# Patient Record
Sex: Female | Born: 1985 | Race: Black or African American | Hispanic: Yes | Marital: Single | Smoking: Current every day smoker
Health system: Southern US, Community
[De-identification: ages and names within clinical notes are randomized; demographics above are authoritative.]

## PROBLEM LIST (undated history)

## (undated) ENCOUNTER — Inpatient Hospital Stay: Payer: Self-pay

## (undated) DIAGNOSIS — U071 COVID-19: Secondary | ICD-10-CM

## (undated) DIAGNOSIS — J45909 Unspecified asthma, uncomplicated: Secondary | ICD-10-CM

## (undated) DIAGNOSIS — O24419 Gestational diabetes mellitus in pregnancy, unspecified control: Secondary | ICD-10-CM

## (undated) HISTORY — DX: Gestational diabetes mellitus in pregnancy, unspecified control: O24.419

---

## 2005-02-10 ENCOUNTER — Emergency Department: Payer: Self-pay | Admitting: Emergency Medicine

## 2005-10-25 ENCOUNTER — Emergency Department: Payer: Self-pay | Admitting: Emergency Medicine

## 2006-01-13 ENCOUNTER — Ambulatory Visit: Payer: Self-pay | Admitting: Advanced Practice Midwife

## 2006-03-15 ENCOUNTER — Emergency Department: Payer: Self-pay | Admitting: Emergency Medicine

## 2006-07-30 ENCOUNTER — Inpatient Hospital Stay: Payer: Self-pay

## 2007-02-07 ENCOUNTER — Emergency Department: Payer: Self-pay | Admitting: Emergency Medicine

## 2007-02-07 ENCOUNTER — Other Ambulatory Visit: Payer: Self-pay

## 2007-12-14 ENCOUNTER — Ambulatory Visit: Payer: Self-pay | Admitting: Family Medicine

## 2007-12-16 ENCOUNTER — Ambulatory Visit: Payer: Self-pay | Admitting: Family Medicine

## 2008-02-17 ENCOUNTER — Ambulatory Visit: Payer: Self-pay | Admitting: General Surgery

## 2008-02-21 ENCOUNTER — Ambulatory Visit: Payer: Self-pay | Admitting: General Surgery

## 2008-05-29 ENCOUNTER — Emergency Department: Payer: Self-pay | Admitting: Emergency Medicine

## 2008-08-07 ENCOUNTER — Ambulatory Visit: Payer: Self-pay | Admitting: Certified Nurse Midwife

## 2008-08-22 ENCOUNTER — Emergency Department: Payer: Self-pay | Admitting: Emergency Medicine

## 2008-10-27 ENCOUNTER — Emergency Department: Payer: Self-pay | Admitting: Emergency Medicine

## 2008-10-29 ENCOUNTER — Emergency Department: Payer: Self-pay | Admitting: Emergency Medicine

## 2008-10-29 ENCOUNTER — Observation Stay: Payer: Self-pay

## 2008-12-31 ENCOUNTER — Observation Stay: Payer: Self-pay | Admitting: Obstetrics and Gynecology

## 2009-01-21 ENCOUNTER — Observation Stay: Payer: Self-pay | Admitting: Obstetrics and Gynecology

## 2009-01-22 ENCOUNTER — Ambulatory Visit: Payer: Self-pay | Admitting: Obstetrics and Gynecology

## 2009-01-23 ENCOUNTER — Observation Stay: Payer: Self-pay

## 2009-02-10 ENCOUNTER — Observation Stay: Payer: Self-pay | Admitting: Obstetrics and Gynecology

## 2009-02-19 ENCOUNTER — Observation Stay: Payer: Self-pay | Admitting: Obstetrics and Gynecology

## 2009-02-26 ENCOUNTER — Observation Stay: Payer: Self-pay

## 2009-03-07 ENCOUNTER — Inpatient Hospital Stay: Payer: Self-pay | Admitting: Obstetrics and Gynecology

## 2011-03-28 ENCOUNTER — Emergency Department: Payer: Self-pay | Admitting: Unknown Physician Specialty

## 2011-06-03 ENCOUNTER — Emergency Department: Payer: Self-pay | Admitting: Emergency Medicine

## 2011-12-02 ENCOUNTER — Emergency Department: Payer: Self-pay | Admitting: Internal Medicine

## 2011-12-02 LAB — URINALYSIS, COMPLETE
Bacteria: NONE SEEN
Bilirubin,UR: NEGATIVE
Blood: NEGATIVE
Glucose,UR: NEGATIVE mg/dL (ref 0–75)
Leukocyte Esterase: NEGATIVE
Nitrite: NEGATIVE
WBC UR: <1 /HPF (ref 0–5)

## 2011-12-02 LAB — PREGNANCY, URINE: Pregnancy Test, Urine: NEGATIVE m[IU]/mL

## 2012-11-10 ENCOUNTER — Emergency Department: Payer: Self-pay | Admitting: Emergency Medicine

## 2014-01-24 ENCOUNTER — Emergency Department: Payer: Self-pay | Admitting: Emergency Medicine

## 2014-01-24 LAB — URINALYSIS, COMPLETE
BILIRUBIN, UR: NEGATIVE
Blood: NEGATIVE
Glucose,UR: NEGATIVE mg/dL (ref 0–75)
Ketone: NEGATIVE
LEUKOCYTE ESTERASE: NEGATIVE
Nitrite: NEGATIVE
PH: 7 (ref 4.5–8.0)
PROTEIN: NEGATIVE
Specific Gravity: 1.023 (ref 1.003–1.030)
Squamous Epithelial: 4
WBC UR: 2 /HPF (ref 0–5)

## 2014-01-26 LAB — URINE CULTURE

## 2015-05-26 NOTE — L&D Delivery Note (Signed)
Delivery Note At 8:42 AM a viable unspecified sex was delivered via Vaginal, Spontaneous Delivery (Presentation:LOA ).  APGAR: , ; weight pending.   Placenta status: spontaneous.  Cord: 3VC without complications.  Anesthesia:  Epidural Episiotomy:  none Lacerations:  none Est. Blood Loss (mL):  250mL  Mom to postpartum.  Baby to Couplet care / Skin to Skin.  Donna Forbes 02/22/2016, 8:52 AM

## 2015-06-03 ENCOUNTER — Encounter: Payer: Self-pay | Admitting: Emergency Medicine

## 2015-06-03 ENCOUNTER — Emergency Department
Admission: EM | Admit: 2015-06-03 | Discharge: 2015-06-03 | Disposition: A | Discharge status: Home / Self Care | Payer: Managed Care, Other (non HMO) | Attending: Emergency Medicine | Admitting: Emergency Medicine

## 2015-06-03 DIAGNOSIS — F17200 Nicotine dependence, unspecified, uncomplicated: Secondary | ICD-10-CM | POA: Insufficient documentation

## 2015-06-03 DIAGNOSIS — M545 Low back pain: Secondary | ICD-10-CM | POA: Insufficient documentation

## 2015-06-03 DIAGNOSIS — Z3202 Encounter for pregnancy test, result negative: Secondary | ICD-10-CM | POA: Insufficient documentation

## 2015-06-03 LAB — URINALYSIS COMPLETE WITH MICROSCOPIC (ARMC ONLY)
BILIRUBIN URINE: NEGATIVE
Bacteria, UA: NONE SEEN
Glucose, UA: NEGATIVE mg/dL
Hgb urine dipstick: NEGATIVE
KETONES UR: NEGATIVE mg/dL
LEUKOCYTES UA: NEGATIVE
NITRITE: NEGATIVE
Protein, ur: NEGATIVE mg/dL
Specific Gravity, Urine: 1.016 (ref 1.005–1.030)
pH: 7 (ref 5.0–8.0)

## 2015-06-03 LAB — POC URINE PREG, ED: PREG TEST UR: NEGATIVE

## 2015-06-03 MED ORDER — LIDOCAINE 5 % EX PTCH: 1.0000 | MEDICATED_PATCH | Freq: Two times a day (BID) | CUTANEOUS | Status: DC

## 2015-06-03 MED ORDER — CYCLOBENZAPRINE HCL 5 MG PO TABS: 5.0000 mg | ORAL_TABLET | Freq: Three times a day (TID) | ORAL | Status: DC | PRN

## 2015-06-03 MED ORDER — CYCLOBENZAPRINE HCL 10 MG PO TABS
5.0000 mg | ORAL_TABLET | Freq: Once | ORAL | Status: AC
  Administered 2015-06-03: 5 mg via ORAL
  Filled 2015-06-03: qty 1

## 2015-06-03 NOTE — ED Provider Notes (Signed)
[facility] Emergency Department Provider Note    ____________________________________________  Time seen: 1900  I have reviewed the triage vital signs and the nursing notes.   HISTORY  Chief Complaint Back Pain   History limited by: Not Limited   HPI CHAIA IKARD is a 30 y.o. female who presents to the emergency department today because of concerns for left lower back pain. The patient states she has been having some discomfort and pain there for the past 2 weeks. It has been constant. She states that yesterday it started becoming more severe. She denies any trauma to her back either 2 weeks ago her yesterday. She states that today almost any movement makes it worse. It is severe. It is sharp. She states she has never had pain quite like this before although she has had pinched nerves in her back before. She denies any fevers. Denies any dysuria. Denies any blood in her urine. She states she has just restarted her. After coming off of birth control.     History reviewed. No pertinent past medical history.  There are no active problems to display for this patient.   History reviewed. No pertinent past surgical history.  No current outpatient prescriptions on file.  Allergies Review of patient's allergies indicates no known allergies.  History reviewed. No pertinent family history.  Social History Social History  Substance Use Topics  . Smoking status: Current Every Day Smoker  . Smokeless tobacco: None  . Alcohol Use: Yes    Review of Systems  Constitutional: Negative for fever. Cardiovascular: Negative for chest pain. Respiratory: Negative for shortness of breath. Gastrointestinal: Negative for abdominal pain, vomiting and diarrhea. Genitourinary: Negative for dysuria. Musculoskeletal: Positive for left lower back pain Skin: Negative for rash. Neurological: Negative for headaches, focal weakness or numbness.   10-point ROS  otherwise negative.  ____________________________________________   PHYSICAL EXAM:  VITAL SIGNS:  98.2 F (36.8 C)  66  16  105/66 mmHg  98 %    Constitutional: Alert and oriented. Well appearing and in no distress. Eyes: Conjunctivae are normal. PERRL. Normal extraocular movements. ENT   Head: Normocephalic and atraumatic.   Nose: No congestion/rhinnorhea.   Mouth/Throat: Mucous membranes are moist.   Neck: No stridor. Hematological/Lymphatic/Immunilogical: No cervical lymphadenopathy. Cardiovascular: Normal rate, regular rhythm.  No murmurs, rubs, or gallops. Respiratory: Normal respiratory effort without tachypnea nor retractions. Breath sounds are clear and equal bilaterally. No wheezes/rales/rhonchi. Gastrointestinal: Soft and nontender. No distention. There is no CVA tenderness. Genitourinary: Deferred Musculoskeletal: Normal range of motion in all extremities. No joint effusions.  No lower extremity tenderness nor edema. Neurologic:  Normal speech and language. No gross focal neurologic deficits are appreciated.  Skin:  Skin is warm, dry and intact. No rash noted. Psychiatric: Mood and affect are normal. Speech and behavior are normal. Patient exhibits appropriate insight and judgment.  ____________________________________________    LABS (pertinent positives/negatives)  Labs Reviewed  URINALYSIS COMPLETEWITH MICROSCOPIC ([facility] ONLY) - Abnormal; Notable for the following:    Color, Urine YELLOW (*)    APPearance CLOUDY (*)    Squamous Epithelial / LPF 0-5 (*)    All other components within normal limits  POC URINE PREG, ED     ____________________________________________   EKG  None  ____________________________________________    RADIOLOGY  None   ____________________________________________   PROCEDURES  Procedure(s) performed: None  Critical Care performed: No  ____________________________________________   INITIAL  IMPRESSION / ASSESSMENT AND PLAN / ED COURSE  Pertinent labs &  imaging results that were available during my care of the patient were reviewed by me and considered in my medical decision making (see chart for details).  Patient presents to the emergency department today because of left back and flank pain. On exam patient appears well. No acute distress. Patient without any significant tenderness to the back no CVA tenderness. Urine did not show any signs of urinary tract infection and was without blood. Amorphous crystal of unclear significance. This point will give patient Flexeril and Lidoderm patch.   ____________________________________________   FINAL CLINICAL IMPRESSION(S) / ED DIAGNOSES  Final diagnoses:  Left low back pain, with sciatica presence unspecified     Phineas SemenGraydon Alex Mcmanigal, MD 06/03/15 2043

## 2015-06-03 NOTE — ED Notes (Signed)
Pt able to ambulate independently and without difficulty. Pt in no acute distress at time of d/c.

## 2015-06-03 NOTE — Discharge Instructions (Signed)
Please seek medical attention for any high fevers, chest pain, shortness of breath, change in behavior, persistent vomiting, bloody stool or any other new or concerning symptoms. ° ° °Back Pain, Adult °Back pain is very common in adults. The cause of back pain is rarely dangerous and the pain often gets better over time. The cause of your back pain may not be known. Some common causes of back pain include: °· Strain of the muscles or ligaments supporting the spine. °· Wear and tear (degeneration) of the spinal disks. °· Arthritis. °· Direct injury to the back. °For many people, back pain may return. Since back pain is rarely dangerous, most people can learn to manage this condition on their own. °HOME CARE INSTRUCTIONS °Watch your back pain for any changes. The following actions may help to lessen any discomfort you are feeling: °· Remain active. It is stressful on your back to sit or stand in one place for long periods of time. Do not sit, drive, or stand in one place for more than 30 minutes at a time. Take short walks on even surfaces as soon as you are able. Try to increase the length of time you walk each day. °· Exercise regularly as directed by your health care provider. Exercise helps your back heal faster. It also helps avoid future injury by keeping your muscles strong and flexible. °· Do not stay in bed. Resting more than 1-2 days can delay your recovery. °· Pay attention to your body when you bend and lift. The most comfortable positions are those that put less stress on your recovering back. Always use proper lifting techniques, including: °¨ Bending your knees. °¨ Keeping the load close to your body. °¨ Avoiding twisting. °· Find a comfortable position to sleep. Use a firm mattress and lie on your side with your knees slightly bent. If you lie on your back, put a pillow under your knees. °· Avoid feeling anxious or stressed. Stress increases muscle tension and can worsen back pain. It is important to  recognize when you are anxious or stressed and learn ways to manage it, such as with exercise. °· Take medicines only as directed by your health care provider. Over-the-counter medicines to reduce pain and inflammation are often the most helpful. Your health care provider may prescribe muscle relaxant drugs. These medicines help dull your pain so you can more quickly return to your normal activities and healthy exercise. °· Apply ice to the injured area: °¨ Put ice in a plastic bag. °¨ Place a towel between your skin and the bag. °¨ Leave the ice on for 20 minutes, 2-3 times a day for the first 2-3 days. After that, ice and heat may be alternated to reduce pain and spasms. °· Maintain a healthy weight. Excess weight puts extra stress on your back and makes it difficult to maintain good posture. °SEEK MEDICAL CARE IF: °· You have pain that is not relieved with rest or medicine. °· You have increasing pain going down into the legs or buttocks. °· You have pain that does not improve in one week. °· You have night pain. °· You lose weight. °· You have a fever or chills. °SEEK IMMEDIATE MEDICAL CARE IF:  °· You develop new bowel or bladder control problems. °· You have unusual weakness or numbness in your arms or legs. °· You develop nausea or vomiting. °· You develop abdominal pain. °· You feel faint. °  °This information is not intended to replace advice given to you by your health care provider. Make   sure you discuss any questions you have with your health care provider. °  °Document Released: 05/11/2005 Document Revised: 06/01/2014 Document Reviewed: 09/12/2013 °Elsevier Interactive Patient Education ©2016 [employer] ° °

## 2015-06-03 NOTE — ED Notes (Signed)
Pt to ed with c/o back pain that started yesterday.  Denies injury but states movement and deep breaths make pain worse.

## 2015-06-03 NOTE — ED Notes (Signed)
Assessment per MD

## 2015-06-26 ENCOUNTER — Encounter: Payer: Self-pay | Admitting: *Deleted

## 2015-06-26 ENCOUNTER — Emergency Department
Admission: EM | Admit: 2015-06-26 | Discharge: 2015-06-26 | Disposition: A | Discharge status: Home / Self Care | Payer: Managed Care, Other (non HMO) | Attending: Emergency Medicine | Admitting: Emergency Medicine

## 2015-06-26 ENCOUNTER — Emergency Department: Payer: Managed Care, Other (non HMO)

## 2015-06-26 DIAGNOSIS — O99331 Smoking (tobacco) complicating pregnancy, first trimester: Secondary | ICD-10-CM | POA: Diagnosis not present

## 2015-06-26 DIAGNOSIS — Z3A01 Less than 8 weeks gestation of pregnancy: Secondary | ICD-10-CM | POA: Diagnosis not present

## 2015-06-26 DIAGNOSIS — Z3490 Encounter for supervision of normal pregnancy, unspecified, unspecified trimester: Secondary | ICD-10-CM

## 2015-06-26 DIAGNOSIS — O9989 Other specified diseases and conditions complicating pregnancy, childbirth and the puerperium: Secondary | ICD-10-CM | POA: Insufficient documentation

## 2015-06-26 DIAGNOSIS — F17200 Nicotine dependence, unspecified, uncomplicated: Secondary | ICD-10-CM | POA: Diagnosis not present

## 2015-06-26 DIAGNOSIS — R102 Pelvic and perineal pain: Secondary | ICD-10-CM | POA: Diagnosis not present

## 2015-06-26 LAB — URINALYSIS COMPLETE WITH MICROSCOPIC (ARMC ONLY)
BILIRUBIN URINE: NEGATIVE
GLUCOSE, UA: 50 mg/dL — AB
Hgb urine dipstick: NEGATIVE
Ketones, ur: NEGATIVE mg/dL
Leukocytes, UA: NEGATIVE
Nitrite: NEGATIVE
PH: 7 (ref 5.0–8.0)
Protein, ur: 30 mg/dL — AB
Specific Gravity, Urine: 1.029 (ref 1.005–1.030)

## 2015-06-26 LAB — POCT PREGNANCY, URINE: PREG TEST UR: POSITIVE — AB

## 2015-06-26 LAB — ABO/RH: ABO/RH(D): A POS

## 2015-06-26 LAB — CBC
HCT: 34.7 % — ABNORMAL LOW (ref 35.0–47.0)
HEMOGLOBIN: 11.5 g/dL — AB (ref 12.0–16.0)
MCH: 26.6 pg (ref 26.0–34.0)
MCHC: 33 g/dL (ref 32.0–36.0)
MCV: 80.7 fL (ref 80.0–100.0)
PLATELETS: 285 10*3/uL (ref 150–440)
RBC: 4.3 MIL/uL (ref 3.80–5.20)
RDW: 13.1 % (ref 11.5–14.5)
WBC: 9 10*3/uL (ref 3.6–11.0)

## 2015-06-26 LAB — HCG, QUANTITATIVE, PREGNANCY: HCG, BETA CHAIN, QUANT, S: 3010 m[IU]/mL — AB (ref <5)

## 2015-06-26 MED ORDER — PRENATAL VITAMINS 0.8 MG PO TABS: 1.0000 | ORAL_TABLET | Freq: Every day | ORAL | Status: DC

## 2015-06-26 MED ORDER — ACETAMINOPHEN 325 MG PO TABS
650.0000 mg | ORAL_TABLET | Freq: Once | ORAL | Status: AC
  Administered 2015-06-26: 650 mg via ORAL

## 2015-06-26 MED ORDER — ACETAMINOPHEN 325 MG PO TABS
ORAL_TABLET | ORAL | Status: AC
  Administered 2015-06-26: 650 mg via ORAL
  Filled 2015-06-26: qty 2

## 2015-06-26 NOTE — ED Provider Notes (Signed)
[facility] Emergency Department Provider Note  ____________________________________________    I have reviewed the triage vital signs and the nursing notes.   HISTORY  Chief Complaint Pelvic Pain    HPI MAKENLEIGH CROWNOVER is a 30 y.o. female who presents with complaints of a sense of pressure in her pelvis for the last 3 days. She denies vaginal bleeding. She denies vaginal discharge. She denies dysuria. No nausea or vomiting. She is never had this pain before. She reports she recently came off the Implanon birth control method approximately a month and a half ago. She has thought that maybe she is going to start a menstrual cycle but she has not had any bleeding     History reviewed. No pertinent past medical history.  There are no active problems to display for this patient.   History reviewed. No pertinent past surgical history.  Current Outpatient Rx  Name  Route  Sig  Dispense  Refill  . cyclobenzaprine (FLEXERIL) 5 MG tablet   Oral   Take 1 tablet (5 mg total) by mouth every 8 (eight) hours as needed for muscle spasms.   20 tablet   0   . lidocaine (LIDODERM) 5 %   Transdermal   Place 1 patch onto the skin every 12 (twelve) hours. Remove & Discard patch within 12 hours or as directed by MD   10 patch   0     Allergies Review of patient's allergies indicates no known allergies.  No family history on file.  Social History Social History  Substance Use Topics  . Smoking status: Current Every Day Smoker  . Smokeless tobacco: None  . Alcohol Use: Yes    Review of Systems  Constitutional: Negative for fever. Eyes: Negative for visual changes. ENT: Negative for sore throat Cardiovascular: Negative for palpitations Respiratory: Negative for cough Gastrointestinal: Negative for abdominal pain, vomiting and diarrhea. Genitourinary: Negative for dysuria. Pelvic pressure as above Musculoskeletal: Negative for back pain. Skin:  Negative for rash. Neurological: Negative for headaches  Psychiatric: No anxiety    ____________________________________________   PHYSICAL EXAM:  VITAL SIGNS: ED Triage Vitals  Enc Vitals Group     BP 06/26/15 1404 134/72 mmHg     Pulse Rate 06/26/15 1404 80     Resp 06/26/15 1404 20     Temp 06/26/15 1404 98 F (36.7 C)     Temp Source 06/26/15 1404 Oral     SpO2 06/26/15 1404 98 %     Weight 06/26/15 1404 170 lb (77.111 kg)     Height 06/26/15 1404  (1.676 m)     Head Cir --      Peak Flow --      Pain Score 06/26/15 1403 8     Pain Loc --      Pain Edu? --      Excl. in GC? --      Constitutional: Alert and oriented. Well appearing and in no distress. Eyes: Conjunctivae are normal.  ENT   Head: Normocephalic and atraumatic.   Mouth/Throat: Mucous membranes are moist. Cardiovascular: Normal rate, regular rhythm. Normal and symmetric distal pulses are present in all extremities.  Respiratory: Normal respiratory effort without tachypnea nor retractions. Breath sounds are clear and equal bilaterally.  Gastrointestinal: Soft and non-tender in all quadrants. No distention. There is no CVA tenderness. Genitourinary: deferred Musculoskeletal: Nontender with normal range of motion in all extremities.  Neurologic:  Normal speech and language. No gross focal neurologic deficits  are appreciated. Skin:  Skin is warm, dry and intact. No rash noted. Psychiatric: Mood and affect are normal. Patient exhibits appropriate insight and judgment.  ____________________________________________    LABS (pertinent positives/negatives)  Labs Reviewed  URINALYSIS COMPLETEWITH MICROSCOPIC ([facility] ONLY) - Abnormal; Notable for the following:    Color, Urine YELLOW (*)    APPearance CLEAR (*)    Glucose, UA 50 (*)    Protein, ur 30 (*)    Bacteria, UA RARE (*)    Squamous Epithelial / LPF 0-5 (*)    All other components within normal limits  POCT PREGNANCY, URINE -  Abnormal; Notable for the following:    Preg Test, Ur POSITIVE (*)    All other components within normal limits  HCG, QUANTITATIVE, PREGNANCY  CBC  POC URINE PREG, ED  ABO/RH    ____________________________________________   EKG  None  ____________________________________________    RADIOLOGY I have personally reviewed any xrays that were ordered on this patient: Ultrasound shows a [redacted] week gestational sac IUP  ____________________________________________   PROCEDURES  Procedure(s) performed: none  Critical Care performed: none  ____________________________________________   INITIAL IMPRESSION / ASSESSMENT AND PLAN / ED COURSE  Pertinent labs & imaging results that were available during my care of the patient were reviewed by me and considered in my medical decision making (see chart for details).  Patient presents with pelvic pressure. She is not aware that she was pregnant. We will obtain a beta Quant and ultrasound and reevaluate.  Ultrasound shows likely IUP. Patient's discomfort is improved with Tylenol. Lab work is unremarkable. She is not having any vaginal bleeding. I will have her follow-up with OB with return precautions discussed  ____________________________________________   FINAL CLINICAL IMPRESSION(S) / ED DIAGNOSES  Final diagnoses:  Pregnancy at early stage     Jene Every, MD 06/26/15 1646

## 2015-06-26 NOTE — ED Notes (Signed)
States she developed pelvic pressure since this past weekend. Denies any n/v ,fever vaginal discharge or bleeding.

## 2015-06-26 NOTE — Discharge Instructions (Signed)
First Trimester of Pregnancy The first trimester of pregnancy is from week 1 until the end of week 12 (months 1 through 3). A week after a sperm fertilizes an egg, the egg will implant on the wall of the uterus. This embryo will begin to develop into a baby. Genes from you and your partner are forming the baby. The female genes determine whether the baby is a boy or a girl. At 6-8 weeks, the eyes and face are formed, and the heartbeat can be seen on ultrasound. At the end of 12 weeks, all the baby's organs are formed.  Now that you are pregnant, you will want to do everything you can to have a healthy baby. Two of the most important things are to get good prenatal care and to follow your health care provider's instructions. Prenatal care is all the medical care you receive before the baby's birth. This care will help prevent, find, and treat any problems during the pregnancy and childbirth. BODY CHANGES Your body goes through many changes during pregnancy. The changes vary from woman to woman.   You may gain or lose a couple of pounds at first.  You may feel sick to your stomach (nauseous) and throw up (vomit). If the vomiting is uncontrollable, call your health care provider.  You may tire easily.  You may develop headaches that can be relieved by medicines approved by your health care provider.  You may urinate more often. Painful urination may mean you have a bladder infection.  You may develop heartburn as a result of your pregnancy.  You may develop constipation because certain hormones are causing the muscles that push waste through your intestines to slow down.  You may develop hemorrhoids or swollen, bulging veins (varicose veins).  Your breasts may begin to grow larger and become tender. Your nipples may stick out more, and the tissue that surrounds them (areola) may become darker.  Your gums may bleed and may be sensitive to brushing and flossing.  Dark spots or blotches (chloasma,  mask of pregnancy) may develop on your face. This will likely fade after the baby is born.  Your menstrual periods will stop.  You may have a loss of appetite.  You may develop cravings for certain kinds of food.  You may have changes in your emotions from day to day, such as being excited to be pregnant or being concerned that something may go wrong with the pregnancy and baby.  You may have more vivid and strange dreams.  You may have changes in your hair. These can include thickening of your hair, rapid growth, and changes in texture. Some women also have hair loss during or after pregnancy, or hair that feels dry or thin. Your hair will most likely return to normal after your baby is born. WHAT TO EXPECT AT YOUR PRENATAL VISITS During a routine prenatal visit:  You will be weighed to make sure you and the baby are growing normally.  Your blood pressure will be taken.  Your abdomen will be measured to track your baby's growth.  The fetal heartbeat will be listened to starting around week 10 or 12 of your pregnancy.  Test results from any previous visits will be discussed. Your health care provider may ask you:  How you are feeling.  If you are feeling the baby move.  If you have had any abnormal symptoms, such as leaking fluid, bleeding, severe headaches, or abdominal cramping.  If you are using any tobacco products,   including cigarettes, chewing tobacco, and electronic cigarettes.  If you have any questions. Other tests that may be performed during your first trimester include:  Blood tests to find your blood type and to check for the presence of any previous infections. They will also be used to check for low iron levels (anemia) and Rh antibodies. Later in the pregnancy, blood tests for diabetes will be done along with other tests if problems develop.  Urine tests to check for infections, diabetes, or protein in the urine.  An ultrasound to confirm the proper growth  and development of the baby.  An amniocentesis to check for possible genetic problems.  Fetal screens for spina bifida and Down syndrome.  You may need other tests to make sure you and the baby are doing well.  HIV (human immunodeficiency virus) testing. Routine prenatal testing includes screening for HIV, unless you choose not to have this test. HOME CARE INSTRUCTIONS  Medicines  Follow your health care provider's instructions regarding medicine use. Specific medicines may be either safe or unsafe to take during pregnancy.  Take your prenatal vitamins as directed.  If you develop constipation, try taking a stool softener if your health care provider approves. Diet  Eat regular, well-balanced meals. Choose a variety of foods, such as meat or vegetable-based protein, fish, milk and low-fat dairy products, vegetables, fruits, and whole grain breads and cereals. Your health care provider will help you determine the amount of weight gain that is right for you.  Avoid raw meat and uncooked cheese. These carry germs that can cause birth defects in the baby.  Eating four or five small meals rather than three large meals a day may help relieve nausea and vomiting. If you start to feel nauseous, eating a few soda crackers can be helpful. Drinking liquids between meals instead of during meals also seems to help nausea and vomiting.  If you develop constipation, eat more high-fiber foods, such as fresh vegetables or fruit and whole grains. Drink enough fluids to keep your urine clear or pale yellow. Activity and Exercise  Exercise only as directed by your health care provider. Exercising will help you:  Control your weight.  Stay in shape.  Be prepared for labor and delivery.  Experiencing pain or cramping in the lower abdomen or low back is a good sign that you should stop exercising. Check with your health care provider before continuing normal exercises.  Try to avoid standing for long  periods of time. Move your legs often if you must stand in one place for a long time.  Avoid heavy lifting.  Wear low-heeled shoes, and practice good posture.  You may continue to have sex unless your health care provider directs you otherwise. Relief of Pain or Discomfort  Wear a good support bra for breast tenderness.   Take warm sitz baths to soothe any pain or discomfort caused by hemorrhoids. Use hemorrhoid cream if your health care provider approves.   Rest with your legs elevated if you have leg cramps or low back pain.  If you develop varicose veins in your legs, wear support hose. Elevate your feet for 15 minutes, 3-4 times a day. Limit salt in your diet. Prenatal Care  Schedule your prenatal visits by the twelfth week of pregnancy. They are usually scheduled monthly at first, then more often in the last 2 months before delivery.  Write down your questions. Take them to your prenatal visits.  Keep all your prenatal visits as directed by your   health care provider. Safety  Wear your seat belt at all times when driving.  Make a list of emergency phone numbers, including numbers for family, friends, the hospital, and police and fire departments. General Tips  Ask your health care provider for a referral to a local prenatal education class. Begin classes no later than at the beginning of month 6 of your pregnancy.  Ask for help if you have counseling or nutritional needs during pregnancy. Your health care provider can offer advice or refer you to specialists for help with various needs.  Do not use hot tubs, steam rooms, or saunas.  Do not douche or use tampons or scented sanitary pads.  Do not cross your legs for long periods of time.  Avoid cat litter boxes and soil used by cats. These carry germs that can cause birth defects in the baby and possibly loss of the fetus by miscarriage or stillbirth.  Avoid all smoking, herbs, alcohol, and medicines not prescribed by  your health care provider. Chemicals in these affect the formation and growth of the baby.  Do not use any tobacco products, including cigarettes, chewing tobacco, and electronic cigarettes. If you need help quitting, ask your health care provider. You may receive counseling support and other resources to help you quit.  Schedule a dentist appointment. At home, brush your teeth with a soft toothbrush and be gentle when you floss. SEEK MEDICAL CARE IF:   You have dizziness.  You have mild pelvic cramps, pelvic pressure, or nagging pain in the abdominal area.  You have persistent nausea, vomiting, or diarrhea.  You have a bad smelling vaginal discharge.  You have pain with urination.  You notice increased swelling in your face, hands, legs, or ankles. SEEK IMMEDIATE MEDICAL CARE IF:   You have a fever.  You are leaking fluid from your vagina.  You have spotting or bleeding from your vagina.  You have severe abdominal cramping or pain.  You have rapid weight gain or loss.  You vomit blood or material that looks like coffee grounds.  You are exposed to German measles and have never had them.  You are exposed to fifth disease or chickenpox.  You develop a severe headache.  You have shortness of breath.  You have any kind of trauma, such as from a fall or a car accident.   This information is not intended to replace advice given to you by your health care provider. Make sure you discuss any questions you have with your health care provider.   Document Released: 05/05/2001 Document Revised: 06/01/2014 Document Reviewed: 03/21/2013 Elsevier Interactive Patient Education 2016 [employer] Inc[employer]  

## 2015-06-26 NOTE — ED Notes (Signed)
Pt complains of pelvis pain /pressure since Saturday, pt denies any other symptoms

## 2015-07-19 LAB — OB RESULTS CONSOLE GC/CHLAMYDIA
CHLAMYDIA, DNA PROBE: NEGATIVE
Gonorrhea: NEGATIVE

## 2015-07-29 LAB — OB RESULTS CONSOLE HEPATITIS B SURFACE ANTIGEN: Hepatitis B Surface Ag: NEGATIVE

## 2015-07-29 LAB — OB RESULTS CONSOLE RUBELLA ANTIBODY, IGM: RUBELLA: IMMUNE

## 2015-07-29 LAB — OB RESULTS CONSOLE VARICELLA ZOSTER ANTIBODY, IGG: VARICELLA IGG: IMMUNE

## 2015-07-29 LAB — OB RESULTS CONSOLE ABO/RH: RH Type: POSITIVE

## 2015-07-29 LAB — OB RESULTS CONSOLE RPR: RPR: NONREACTIVE

## 2015-07-29 LAB — OB RESULTS CONSOLE HIV ANTIBODY (ROUTINE TESTING): HIV: NONREACTIVE

## 2015-07-29 LAB — OB RESULTS CONSOLE ANTIBODY SCREEN: ANTIBODY SCREEN: NEGATIVE

## 2015-09-24 ENCOUNTER — Encounter: Payer: Self-pay | Admitting: Medical Oncology

## 2015-09-24 ENCOUNTER — Emergency Department
Admission: EM | Admit: 2015-09-24 | Discharge: 2015-09-24 | Disposition: A | Discharge status: Home / Self Care | Payer: Medicaid Other | Attending: Emergency Medicine | Admitting: Emergency Medicine

## 2015-09-24 DIAGNOSIS — M25552 Pain in left hip: Secondary | ICD-10-CM | POA: Diagnosis present

## 2015-09-24 DIAGNOSIS — Z3A16 16 weeks gestation of pregnancy: Secondary | ICD-10-CM | POA: Diagnosis not present

## 2015-09-24 DIAGNOSIS — F17200 Nicotine dependence, unspecified, uncomplicated: Secondary | ICD-10-CM | POA: Insufficient documentation

## 2015-09-24 DIAGNOSIS — M5432 Sciatica, left side: Secondary | ICD-10-CM | POA: Insufficient documentation

## 2015-09-24 DIAGNOSIS — O26892 Other specified pregnancy related conditions, second trimester: Secondary | ICD-10-CM | POA: Insufficient documentation

## 2015-09-24 NOTE — ED Notes (Addendum)
Pt reports pain to left hip down leg without injury.

## 2015-09-24 NOTE — ED Notes (Signed)
States she developed pain from left hip area which radiates into left leg /foot last pm  States pain is intermittent and sharp at times  But having increased pain to ankle able to bear partial wt  Positive pulses

## 2015-09-24 NOTE — ED Provider Notes (Signed)
[facility] Emergency Department Provider Note   ____________________________________________  Time seen: Approximately 7:10 PM  I have reviewed the triage vital signs and the nursing notes.   HISTORY  Chief Complaint Leg Pain   HPI Donna Forbes is a 30 y.o. female is brought in by her husband with complaints of pain from her left hip radiating down to her left foot and toes. Patient states that she has had similar episodes often known for a couple years and has been told it is "a pinched nerve". Patient denies any recent fall or injury to her back. Currently she is approximately [redacted] weeks pregnant. She denies any urinary symptoms, hematuria, vaginal discharge or vaginal pain. Patient states that initially the pain was in her left hip and radiated to her left thigh. It then progressed to her left lower leg and now down into her foot. She does feel that her toes are numb at times and this comes and goes. She denies any fever or chills, no nausea or vomiting, and currently denies any low back pain. Patient is ambulatory. Currently she rates her pain as 8/10.   History reviewed. No pertinent past medical history.  There are no active problems to display for this patient.   History reviewed. No pertinent past surgical history.  Current Outpatient Rx  Name  Route  Sig  Dispense  Refill  . cyclobenzaprine (FLEXERIL) 5 MG tablet   Oral   Take 1 tablet (5 mg total) by mouth every 8 (eight) hours as needed for muscle spasms.   20 tablet   0   . lidocaine (LIDODERM) 5 %   Transdermal   Place 1 patch onto the skin every 12 (twelve) hours. Remove & Discard patch within 12 hours or as directed by MD   10 patch   0   . Prenatal Multivit-Min-Fe-FA (PRENATAL VITAMINS) 0.8 MG tablet   Oral   Take 1 tablet by mouth daily.   30 tablet   1     Allergies Review of patient's allergies indicates no known allergies.  No family history on file.  Social  History Social History  Substance Use Topics  . Smoking status: Current Every Day Smoker  . Smokeless tobacco: None  . Alcohol Use: Yes    Review of Systems Constitutional: No fever/chills Cardiovascular: Denies chest pain. Respiratory: Denies shortness of breath. Gastrointestinal: No abdominal pain.  No nausea, no vomiting.  No diarrhea.  No constipation. Genitourinary: Negative for dysuria. Musculoskeletal: Negative for back pain. Positive left leg radiculopathy. Skin: Negative for rash. Neurological: Negative for headaches, focal weakness. Positive left leg numbness/paresthesias.  10-point ROS otherwise negative.  ____________________________________________   PHYSICAL EXAM:  VITAL SIGNS: ED Triage Vitals  Enc Vitals Group     BP 09/24/15 1832 127/64 mmHg     Pulse Rate 09/24/15 1832 100     Resp 09/24/15 1832 18     Temp 09/24/15 1832 98.9 F (37.2 C)     Temp Source 09/24/15 1832 Oral     SpO2 09/24/15 1832 100 %     Weight 09/24/15 1832 190 lb (86.183 kg)     Height 09/24/15 1832  (1.676 m)     Head Cir --      Peak Flow --      Pain Score 09/24/15 1838 8     Pain Loc --      Pain Edu? --      Excl. in GC? --  Constitutional: Alert and oriented. Well appearing and in no acute distress. Eyes: Conjunctivae are normal. PERRL. EOMI. Head: Atraumatic. Nose: No congestion/rhinnorhea. Mouth/Throat: Mucous membranes are moist.  Oropharynx non-erythematous. Neck: No stridor.   Cardiovascular: Normal rate, regular rhythm. Grossly normal heart sounds.  Good peripheral circulation. Respiratory: Normal respiratory effort.  No retractions. Lungs CTAB. Gastrointestinal: Soft and nontender. Obvious pregnant abdomen without tenderness. Musculoskeletal: On examination of the lower extremities there is no gross deformity or noticeable edema present. Range of motion is without restriction. Skin is warm and dry. Motor sensory function intact. Muscle strength is within  normal limits. Neurologic:  Normal speech and language. No gross focal neurologic deficits are appreciated. No gait instability. Reflexes are equal bilaterally at 2+. Skin:  Skin is warm, dry and intact. No rash noted. No ecchymosis, abrasions, or erythema was noted. Psychiatric: Mood and affect are normal. Speech and behavior are normal.  ____________________________________________   LABS (all labs ordered are listed, but only abnormal results are displayed)  Labs Reviewed - No data to display  PROCEDURES  Procedure(s) performed: None  Critical Care performed: No  ____________________________________________   INITIAL IMPRESSION / ASSESSMENT AND PLAN / ED COURSE  Pertinent labs & imaging results that were available during my care of the patient were reviewed by me and considered in my medical decision making (see chart for details).  Patient has had history of sciatica in the past prior to her current pregnancy. She denies any recent injury and there is no reason to think that trauma is related to this. Patient will follow-up with her primary care or OB/GYN if any continued problems. We discussed ice, heat and positioning of pillows. ____________________________________________   FINAL CLINICAL IMPRESSION(S) / ED DIAGNOSES  Final diagnoses:  Sciatica of left side without back pain      NEW MEDICATIONS STARTED DURING THIS VISIT:  Discharge Medication List as of 09/24/2015  7:45 PM       Note:  This document was prepared using Dragon voice recognition software and may include unintentional dictation errors.    Tommi RumpsRhonda L Griselda Bramblett, PA-C 09/24/15 1954  Arnaldo NatalPaul F Malinda, MD 09/24/15 2029

## 2015-09-24 NOTE — Discharge Instructions (Signed)
Follow-up with your primary care doctor or OB/GYN if any continued problems. Ice or heat to your back as needed. Use 2 pillows under your knees if you're lying on your back. Use one pillow between your knees if lying on your side. You may use Tylenol sparingly if needed for pain.

## 2015-11-12 ENCOUNTER — Observation Stay
Admission: EM | Admit: 2015-11-12 | Discharge: 2015-11-12 | Disposition: A | Discharge status: Home / Self Care | Payer: Medicaid Other | Attending: Obstetrics & Gynecology | Admitting: Obstetrics & Gynecology

## 2015-11-12 DIAGNOSIS — O9989 Other specified diseases and conditions complicating pregnancy, childbirth and the puerperium: Secondary | ICD-10-CM | POA: Diagnosis present

## 2015-11-12 DIAGNOSIS — O1202 Gestational edema, second trimester: Secondary | ICD-10-CM | POA: Insufficient documentation

## 2015-11-12 DIAGNOSIS — R109 Unspecified abdominal pain: Secondary | ICD-10-CM

## 2015-11-12 DIAGNOSIS — R1030 Lower abdominal pain, unspecified: Secondary | ICD-10-CM | POA: Insufficient documentation

## 2015-11-12 DIAGNOSIS — Z3A25 25 weeks gestation of pregnancy: Secondary | ICD-10-CM | POA: Insufficient documentation

## 2015-11-12 DIAGNOSIS — O26899 Other specified pregnancy related conditions, unspecified trimester: Secondary | ICD-10-CM

## 2015-11-12 LAB — CBC
HEMATOCRIT: 32.3 % — AB (ref 35.0–47.0)
HEMOGLOBIN: 10.7 g/dL — AB (ref 12.0–16.0)
MCH: 26.4 pg (ref 26.0–34.0)
MCHC: 33 g/dL (ref 32.0–36.0)
MCV: 80 fL (ref 80.0–100.0)
Platelets: 246 10*3/uL (ref 150–440)
RBC: 4.04 MIL/uL (ref 3.80–5.20)
RDW: 13.3 % (ref 11.5–14.5)
WBC: 9.2 10*3/uL (ref 3.6–11.0)

## 2015-11-12 LAB — URINALYSIS COMPLETE WITH MICROSCOPIC (ARMC ONLY)
Bacteria, UA: NONE SEEN
Bilirubin Urine: NEGATIVE
GLUCOSE, UA: NEGATIVE mg/dL
Hgb urine dipstick: NEGATIVE
NITRITE: NEGATIVE
PROTEIN: NEGATIVE mg/dL
Specific Gravity, Urine: 1.012 (ref 1.005–1.030)
pH: 7 (ref 5.0–8.0)

## 2015-11-12 MED ORDER — ACETAMINOPHEN 325 MG PO TABS: 650.0000 mg | ORAL_TABLET | ORAL | Status: DC | PRN

## 2015-11-12 MED ORDER — ONDANSETRON HCL 4 MG/2ML IJ SOLN
4.0000 mg | Freq: Four times a day (QID) | INTRAMUSCULAR | Status: DC | PRN
Start: 2015-11-12 — End: 2015-11-12

## 2015-11-12 NOTE — Final Progress Note (Signed)
Physician Final Progress Note  Patient ID: Donna Forbes MRN: [id number] DOB/AGE: Jan 18, 1986 30 y.o.  Admit date: 11/12/2015 Admitting provider: Nadara Mustardobert P Krisann Mckenna, MD Discharge date: 11/12/2015  Admission Diagnoses: Lower abdominal pain, edema  Discharge Diagnoses:  Active Problems:   Abdominal pain affecting pregnancy   Edema  Consults: None  Significant Findings/ Diagnostic Studies: Pt is G3P2 at 25 weeks with c/o 24 hour h/o lower abd pain, no VB or ROM, fever or nausea.  Edema comes and goes. No dysuria. PMH- none;  PSH- none. SH- no tob, still working.  FH- noncontributory. Labs: UA neg;  and exam: AF, VSS, no HTN Extr tr edema Abd gravid vtx NT ND no mass SVE closed/thick, NT FHT 150s Toco no ctxs  Discharge Condition: good  Disposition: 01-Home or Self Care  Diet: Regular diet  Discharge Activity: Activity as tolerated     Medication List    ASK your doctor about these medications        cyclobenzaprine 5 MG tablet  Commonly known as:  FLEXERIL  Take 1 tablet (5 mg total) by mouth every 8 (eight) hours as needed for muscle spasms.     lidocaine 5 %  Commonly known as:  LIDODERM  Place 1 patch onto the skin every 12 (twelve) hours. Remove & Discard patch within 12 hours or as directed by MD     Prenatal Vitamins 0.8 MG tablet  Take 1 tablet by mouth daily.         Total time spent taking care of this patient: 15 minutes  Signed: Letitia Libraobert Paul Early Steel 11/12/2015, 3:47 PM

## 2015-11-12 NOTE — Progress Notes (Signed)
[facility] [id number] [place] [address] Phone: [phone] Fax: [phone]  November 12, 2015  Patient: Donna Forbes  Date of Birth: 1985-09-26  Date of Visit: 11/12/2015    To Whom It May Concern:  Donna Forbes was seen and treated in our [facility] on 11/12/2015. Donna Forbes  may return to work on 11/13/15.  Sincerely,  Annamarie MajorPaul Harris, MD [facility]

## 2015-11-12 NOTE — Plan of Care (Signed)
Pt seen by dr Tiburcio Peaharris . Ok for pt to go home. Pt d/c home with d/c instructions

## 2015-11-12 NOTE — Discharge Summary (Signed)
See Final Progress Note 

## 2015-11-13 LAB — URINE CULTURE

## 2015-12-13 LAB — OB RESULTS CONSOLE HGB/HCT, BLOOD
HEMATOCRIT: 30 %
Hemoglobin: 9.8 g/dL

## 2015-12-13 LAB — OB RESULTS CONSOLE PLATELET COUNT: PLATELETS: 275 10*3/uL

## 2016-01-03 ENCOUNTER — Encounter: Payer: Self-pay | Admitting: *Deleted

## 2016-01-03 ENCOUNTER — Encounter: Payer: Medicaid Other | Attending: Obstetrics and Gynecology | Admitting: *Deleted

## 2016-01-03 VITALS — BP 110/66 | Ht 66.0 in | Wt 206.6 lb

## 2016-01-03 DIAGNOSIS — O24419 Gestational diabetes mellitus in pregnancy, unspecified control: Secondary | ICD-10-CM | POA: Diagnosis not present

## 2016-01-03 DIAGNOSIS — Z713 Dietary counseling and surveillance: Secondary | ICD-10-CM | POA: Insufficient documentation

## 2016-01-03 DIAGNOSIS — O24410 Gestational diabetes mellitus in pregnancy, diet controlled: Secondary | ICD-10-CM

## 2016-01-03 DIAGNOSIS — Z6833 Body mass index (BMI) 33.0-33.9, adult: Secondary | ICD-10-CM | POA: Insufficient documentation

## 2016-01-03 NOTE — Progress Notes (Signed)
Diabetes Self-Management Education  Visit Type: First/Initial  Appt. Start Time: 0950 Appt. End Time: 1120  01/03/2016  Ms. Donna Forbes, identified by name and date of birth, is a 30 y.o. female with a diagnosis of Diabetes: Gestational Diabetes.   ASSESSMENT  Blood pressure 110/66, height 5\' 6"  (1.676 m), weight 206 lb 9.6 oz (93.7 kg), last menstrual period 05/09/2015. Body mass index is 33.35 kg/m.      Diabetes Self-Management Education - 01/03/16 1202      Visit Information   Visit Type First/Initial     Initial Visit   Diabetes Type Gestational Diabetes   Are you currently following a meal plan? No   Are you taking your medications as prescribed? Yes   Date Diagnosed August 2017     Health Coping   How would you rate your overall health? Excellent     Psychosocial Assessment   Patient Belief/Attitude about Diabetes Afraid   Self-care barriers None   Self-management support Doctor's office;Family   Patient Concerns Nutrition/Meal planning;Glycemic Control;Weight Control;Healthy Lifestyle   Special Needs None   Preferred Learning Style Hands on;Auditory   Learning Readiness Ready   How often do you need to have someone help you when you read instructions, pamphlets, or other written materials from your doctor or pharmacy? 1 - Never   What is the last grade level you completed in school? College     Pre-Education Assessment   Patient understands the diabetes disease and treatment process. Needs Instruction   Patient understands incorporating nutritional management into lifestyle. Needs Instruction   Patient undertands incorporating physical activity into lifestyle. Needs Instruction   Patient understands monitoring blood glucose, interpreting and using results Needs Instruction   Patient understands prevention, detection, and treatment of chronic complications. Needs Instruction   Patient understands how to develop strategies to address psychosocial issues.  Needs Instruction   Patient understands how to develop strategies to promote health/change behavior. Needs Instruction     Complications   How often do you check your blood sugar? 0 times/day (not testing)  Provided Accu-Chek Guide meter and instructed on use. BG upon return demonstration was 101 mg/dL at 16:1011:10 am - 2 hrs pp.    Have you had a dilated eye exam in the past 12 months? No   Have you had a dental exam in the past 12 months? Yes   Are you checking your feet? Yes   How many days per week are you checking your feet? 7     Dietary Intake   Breakfast cereal and milk   Snack (morning) desserts and sweets   Lunch eats out 4 x week; [place] and [place] foods; fried chicken, potatoes, bread   Snack (afternoon) desserts and sweets   Dinner she cooks - spaghetti, steak, chicken, potatoes, rice, noodles, corn, mac-n-cheese   Beverage(s) sugar sweetened sodas, juices, little water     Exercise   Exercise Type ADL's     Patient Education   Previous Diabetes Education No   Disease state  Definition of diabetes, type 1 and 2, and the diagnosis of diabetes   Nutrition management  Role of diet in the treatment of diabetes and the relationship between the three main macronutrients and blood glucose level   Physical activity and exercise  Role of exercise on diabetes management, blood pressure control and cardiac health.   Monitoring Taught/evaluated SMBG meter.;Purpose and frequency of SMBG.;Identified appropriate SMBG and/or A1C goals.;Ketone testing, when, how.   Chronic complications Relationship between chronic  complications and blood glucose control   Psychosocial adjustment Identified and addressed patients feelings and concerns about diabetes   Preconception care Pregnancy and GDM  Role of pre-pregnancy blood glucose control on the development of the fetus;Reviewed with patient blood glucose goals with pregnancy;Role of family planning for patients with diabetes     Individualized  Goals (developed by patient)   Reducing Risk Improve blood sugars Lose weight Lead a healthier lifestyle Become more fit     Outcomes   Expected Outcomes Demonstrated interest in learning. Expect positive outcomes      Individualized Plan for Diabetes Self-Management Training:   Learning Objective:  Patient will have a greater understanding of diabetes self-management. Patient education plan is to attend individual and/or group sessions per assessed needs and concerns.   Plan:   Patient Instructions  Read booklet on Gestational Diabetes Follow Gestational Meal Planning Guidelines Complete a 3 Day Food Record and bring to next appointment Limit dessert/sweets and fried foods Avoid fruit juices and sugar sweetened beverages Avoid cold cereal for breakfast Don't skip meals Allow 2-3 hours between eating meals and snackx Check blood sugars 4 x day - before breakfast and 2 hrs after every meal and record  Bring blood sugar log to all appointments Call MD for prescription for meter strips and lancets Strips Accu-Chek Guide Lancets   Accu-Chek Fastclix Purchase urine ketone strips if blood sugars not controlled and check urine ketones every am:  If + increase bedtime snack to 1 protein and 2 carbohydrate servings Walk 20-30 minutes at least 5 x week if permitted by MD   Expected Outcomes:  Demonstrated interest in learning. Expect positive outcomes  Education material provided:  Gestational Booklet Gestational Meal Planning Guidelines Viewed Gestational Diabetes Video Meter - Accu-Chek Guide 3 Day Food Record Goals for a Healthy Pregnancy  If problems or questions, patient to contact team via:  Sharion Settler, RN, CCM, CDE [phone]  Future DSME appointment:  Wednesday January 15, 2016 with the dietitian

## 2016-01-03 NOTE — Patient Instructions (Signed)
Read booklet on Gestational Diabetes Follow Gestational Meal Planning Guidelines Complete a 3 Day Food Record and bring to next appointment Limit dessert/sweets and fried foods Avoid fruit juices and sugar sweetened beverages Avoid cold cereal for breakfast Don't skip meals Allow 2-3 hours between eating meals and snackx Check blood sugars 4 x day - before breakfast and 2 hrs after every meal and record  Bring blood sugar log to all appointments Call MD for prescription for meter strips and lancets Strips Accu-Chek Guide Lancets   Accu-Chek Fastclix Purchase urine ketone strips if blood sugars not controlled and check urine ketones every am:  If + increase bedtime snack to 1 protein and 2 carbohydrate servings Walk 20-30 minutes at least 5 x week if permitted by MD

## 2016-01-06 ENCOUNTER — Observation Stay
Admission: EM | Admit: 2016-01-06 | Discharge: 2016-01-06 | Disposition: A | Discharge status: Home / Self Care | Payer: Medicaid Other | Attending: Obstetrics and Gynecology | Admitting: Obstetrics and Gynecology

## 2016-01-06 DIAGNOSIS — Z3A32 32 weeks gestation of pregnancy: Secondary | ICD-10-CM | POA: Insufficient documentation

## 2016-01-06 LAB — URINALYSIS COMPLETE WITH MICROSCOPIC (ARMC ONLY)
Bacteria, UA: NONE SEEN
Bilirubin Urine: NEGATIVE
Glucose, UA: NEGATIVE mg/dL
KETONES UR: NEGATIVE mg/dL
LEUKOCYTES UA: NEGATIVE
Nitrite: NEGATIVE
PH: 7 (ref 5.0–8.0)
PROTEIN: NEGATIVE mg/dL
SPECIFIC GRAVITY, URINE: 1.004 — AB (ref 1.005–1.030)

## 2016-01-06 LAB — FETAL FIBRONECTIN: Fetal Fibronectin: POSITIVE — AB

## 2016-01-06 LAB — GLUCOSE, CAPILLARY: Glucose-Capillary: 82 mg/dL (ref 65–99)

## 2016-01-06 MED ORDER — BETAMETHASONE SOD PHOS & ACET 6 (3-3) MG/ML IJ SUSP
12.0000 mg | Freq: Once | INTRAMUSCULAR | Status: AC
  Administered 2016-01-06: 12 mg via INTRAMUSCULAR
  Filled 2016-01-06: qty 2

## 2016-01-06 MED ORDER — LACTATED RINGERS IV SOLN
INTRAVENOUS | Status: DC
  Administered 2016-01-06: 01:00:00 via INTRAVENOUS

## 2016-01-06 NOTE — OB Triage Note (Signed)
Pt. Arrived to triage with c/o ctx's since approx. 2200.  Positive fetal movement, denies leaking of fluid and vaginal bleeding.

## 2016-01-06 NOTE — Discharge Instructions (Signed)
[facility] on 8/15 in the early morning to receive your second dose of Betamethasone.  Return to the hospital for increase in painful contractions, leaking of fluid, vaginal bleeding, or decreased fetal movement. Keep regularly scheduled appointment with follow up provider.

## 2016-01-06 NOTE — OB Triage Note (Signed)
Patient given discharge instructions and verbalized understanding. No further questions or concerns at this time. Patient ambulated accompanied by spouse and doula.

## 2016-01-06 NOTE — Discharge Summary (Signed)
Physician Discharge Summary  Patient ID: Donna Forbes MRN: [id number] DOB/AGE: 01/11/1986 30 y.o.  Admit date: 01/06/2016 Discharge date: 01/06/2016  Admission Diagnoses: G3 P2002 with IUP at 8983w1d with complaint of contractions since 10:30 the previous evening. The contractions were every 6 minutes apart and felt strong. She admits positive fetal movement. She denies LOF, VB.  Discharge Diagnoses:  Active Problems:   Indication for care in labor and delivery, antepartum No cervical change from admission to discharge, reactive NST  Discharged Condition: good  Hospital Course: patient was admitted, placed on monitors, IV fluids given, labs sent  Consults: None  Significant Diagnostic Studies: labs:   Results for Donna NeighborVASQUEZ, Madell M (MRN [id number]) as of 01/06/2016 03:47  Ref. Range 01/06/2016 00:52 01/06/2016 02:11  Glucose-Capillary Latest Ref Range: 65 - 99 mg/dL  82  Fetal Fibronectin Latest Ref Range: NEGATIVE  POSITIVE (A)   Appearance Latest Ref Range: CLEAR  CLEAR (A)   Bacteria, UA Latest Ref Range: NONE SEEN  NONE SEEN   Bilirubin Urine Latest Ref Range: NEGATIVE  NEGATIVE   Color, Urine Latest Ref Range: YELLOW  STRAW (A)   Glucose Latest Ref Range: NEGATIVE mg/dL NEGATIVE   Hgb urine dipstick Latest Ref Range: NEGATIVE  1+ (A)   Ketones, ur Latest Ref Range: NEGATIVE mg/dL NEGATIVE   Leukocytes, UA Latest Ref Range: NEGATIVE  NEGATIVE   Nitrite Latest Ref Range: NEGATIVE  NEGATIVE   pH Latest Ref Range: 5.0 - 8.0  7.0   Protein Latest Ref Range: NEGATIVE mg/dL NEGATIVE   RBC / HPF Latest Ref Range: 0 - 5 RBC/hpf 0-5   Specific Gravity, Urine Latest Ref Range: 1.005 - 1.030  1.004 (L)   Squamous Epithelial / LPF Latest Ref Range: NONE SEEN  0-5 (A)   WBC, UA Latest Ref Range: 0 - 5 WBC/hpf 0-5   URINE CULTURE Unknown Rpt     Treatments: IV hydration  Discharge Exam: Blood pressure 129/77, pulse 82, height 5\' 6"  (1.676 m), weight 93 kg (205 lb), last menstrual  period 05/09/2015. General appearance: alert, cooperative, appears stated age and mild distress Resp: clear to auscultation bilaterally Cardio: regular rate and rhythm Cervix: 1.5/70/-3 posterior, no change from admission to discharge  Toco: q 6-10 minutes Fetal Well Being: 135 bpm, moderate variability, + accelerations, - decelerations Category I tracing  Disposition: 01-Home or Self Care  Discharge Instructions    Discharge activity:    Complete by:  As directed   Check blood sugar as prescribed Return for second dose of betamethasone early Tuesday am   Discharge activity:  No Restrictions    Complete by:  As directed   Discharge diet:    Complete by:  As directed   Decrease carbohydrates, drink 12-16 8 ounce cups of water per day   Discharge diet:  No restrictions    Complete by:  As directed   Fetal Kick Count:  Lie on our left side for one hour after a meal, and count the number of times your baby kicks.  If it is less than 5 times, get up, move around and drink some juice.  Repeat the test 30 minutes later.  If it is still less than 5 kicks in an hour, notify your doctor.    Complete by:  As directed   No sexual activity restrictions    Complete by:  As directed   Notify physician for a general feeling that "something is not right"    Complete by:  As  directed   Notify physician for increase or change in vaginal discharge    Complete by:  As directed   Notify physician for intestinal cramps, with or without diarrhea, sometimes described as "gas pain"    Complete by:  As directed   Notify physician for leaking of fluid    Complete by:  As directed   Notify physician for low, dull backache, unrelieved by heat or Tylenol    Complete by:  As directed   Notify physician for menstrual like cramps    Complete by:  As directed   Notify physician for pelvic pressure    Complete by:  As directed   Notify physician for uterine contractions.  These may be painless and feel like the uterus is  tightening or the baby is  "balling up"    Complete by:  As directed   Notify physician for vaginal bleeding    Complete by:  As directed   PRETERM LABOR:  Includes any of the follwing symptoms that occur between 20 - [redacted] weeks gestation.  If these symptoms are not stopped, preterm labor can result in preterm delivery, placing your baby at risk    Complete by:  As directed       Medication List    STOP taking these medications   cyclobenzaprine 5 MG tablet Commonly known as:  FLEXERIL   lidocaine 5 % Commonly known as:  LIDODERM     TAKE these medications   Prenatal Vitamins 0.8 MG tablet Take 1 tablet by mouth daily.      Follow-up Information    [facility] [place] [place].   Why:  regular scheduled prenatal appointment Contact information: [address] [place] [address] [phone]         [facility] Tuesday AM for second dose of Betamethasone  Signed: Tresea MallGLEDHILL,Chazz Philson, CNM

## 2016-01-07 ENCOUNTER — Inpatient Hospital Stay
Admission: RE | Admit: 2016-01-07 | Discharge: 2016-01-07 | Disposition: A | Discharge status: Home / Self Care | Payer: Medicaid Other | Attending: Obstetrics and Gynecology | Admitting: Obstetrics and Gynecology

## 2016-01-07 DIAGNOSIS — Z3A32 32 weeks gestation of pregnancy: Secondary | ICD-10-CM | POA: Insufficient documentation

## 2016-01-07 LAB — URINE CULTURE: Culture: <10000 — AB

## 2016-01-07 MED ORDER — BETAMETHASONE SOD PHOS & ACET 6 (3-3) MG/ML IJ SUSP
6.0000 mg | Freq: Once | INTRAMUSCULAR | Status: AC
  Administered 2016-01-07: 6 mg via INTRAMUSCULAR

## 2016-01-07 MED ORDER — BETAMETHASONE SOD PHOS & ACET 6 (3-3) MG/ML IJ SUSP
INTRAMUSCULAR | Status: AC
  Administered 2016-01-07: 6 mg via INTRAMUSCULAR
  Filled 2016-01-07: qty 1

## 2016-01-07 NOTE — OB Triage Note (Signed)
Present for second betamethasone injection.

## 2016-01-15 ENCOUNTER — Ambulatory Visit: Payer: Medicaid Other | Admitting: Dietician

## 2016-01-16 ENCOUNTER — Ambulatory Visit: Payer: Medicaid Other | Admitting: Dietician

## 2016-01-17 ENCOUNTER — Observation Stay
Admission: EM | Admit: 2016-01-17 | Discharge: 2016-01-17 | Disposition: A | Discharge status: Home / Self Care | Payer: Medicaid Other | Attending: Obstetrics and Gynecology | Admitting: Obstetrics and Gynecology

## 2016-01-17 ENCOUNTER — Encounter: Payer: Self-pay | Admitting: *Deleted

## 2016-01-17 DIAGNOSIS — O26899 Other specified pregnancy related conditions, unspecified trimester: Secondary | ICD-10-CM | POA: Diagnosis present

## 2016-01-17 DIAGNOSIS — O4290 Premature rupture of membranes, unspecified as to length of time between rupture and onset of labor, unspecified weeks of gestation: Secondary | ICD-10-CM | POA: Diagnosis present

## 2016-01-17 NOTE — Discharge Instructions (Signed)
Discharge instructions reviewed with patient, labor precautions reviewed, pt. Verbalized understanding. Discharge copies signed and copy given. Pt. Advised to continue with scheduled OB appointments.  Pt. In agreement with discussion.

## 2016-01-17 NOTE — Final Progress Note (Signed)
Physician Final Progress Note  Patient ID: Donna Forbes MRN: [id number] DOB/AGE: 12/22/1985 30 y.o.  Admit date: 01/17/2016 Admitting provider: Vena AustriaAndreas Naysha Sholl, MD Discharge date: 01/17/2016   Admission Diagnoses: Leaking fluid  Discharge Diagnoses: Leaking fluid  Consults: None  Significant Findings/ Diagnostic Studies: none  Procedures: NST FHT 155, moderate variability, +accels, no decels.  Negative nitrazine, ferning, and pooling  Discharge Condition: good  Disposition: 01-Home or Self Care  Diet: Regular diet  Discharge Activity: Activity as tolerated  Discharge Instructions    Discharge activity:  No Restrictions    Complete by:  As directed   No sexual activity restrictions    Complete by:  As directed   Notify physician for a general feeling that "something is not right"    Complete by:  As directed   Notify physician for increase or change in vaginal discharge    Complete by:  As directed   Notify physician for intestinal cramps, with or without diarrhea, sometimes described as "gas pain"    Complete by:  As directed   Notify physician for leaking of fluid    Complete by:  As directed   Notify physician for low, dull backache, unrelieved by heat or Tylenol    Complete by:  As directed   Notify physician for menstrual like cramps    Complete by:  As directed   Notify physician for pelvic pressure    Complete by:  As directed   Notify physician for uterine contractions.  These may be painless and feel like the uterus is tightening or the baby is  "balling up"    Complete by:  As directed   Notify physician for vaginal bleeding    Complete by:  As directed   PRETERM LABOR:  Includes any of the follwing symptoms that occur between 20 - [redacted] weeks gestation.  If these symptoms are not stopped, preterm labor can result in preterm delivery, placing your baby at risk    Complete by:  As directed       Medication List    TAKE these medications   Prenatal Vitamins 0.8  MG tablet Take 1 tablet by mouth daily.        Total time spent taking care of this patient: 20 minutes  Signed: Lorrene ReidSTAEBLER, Donna Forbes 01/17/2016, 9:57 PM

## 2016-01-17 NOTE — OB Triage Note (Signed)
RN to the bedside, pt. Connected to U/S and toco. FHR 160s, no contractions palpated, abd. Soft.  No flush noted, Nitrazine test neg.

## 2016-01-23 ENCOUNTER — Encounter: Payer: Self-pay | Admitting: Dietician

## 2016-01-23 ENCOUNTER — Encounter: Payer: Medicaid Other | Admitting: Dietician

## 2016-01-23 VITALS — BP 108/56 | Ht 66.0 in | Wt 207.9 lb

## 2016-01-23 DIAGNOSIS — O24410 Gestational diabetes mellitus in pregnancy, diet controlled: Secondary | ICD-10-CM

## 2016-01-23 DIAGNOSIS — Z713 Dietary counseling and surveillance: Secondary | ICD-10-CM | POA: Diagnosis not present

## 2016-01-23 NOTE — Progress Notes (Signed)
   Patient's BG record indicates BGs are within goal range; post-meal BGs are sometimes elevated, ranging 73-145. She has not been able to test in the past week as she has not been able to obtain more strips due to issues with prescription/ insurance.   Patient's food diary indicates some meals high in carbohydrate content. Instructed on appropriate carbohydrate intake and nutrient balance.   Provided 1700kcal meal plan, and wrote individualized menus based on patient's food preferences.  Instructed patient on food safety, including avoidance of Listeriosis, and limiting mercury from fish.  Discussed importance of maintaining healthy lifestyle habits to reduce risk of Type 2 DM as well as Gestational DM with any future pregnancies.  Advised patient to use any remaining testing supplies to test some BGs after delivery, and to have BG tested ideally annually, as well as prior to attempting future pregnancies.

## 2016-01-23 NOTE — Patient Instructions (Signed)
   Keep carb intake to 3 servings with each meal, be careful not to go over that amount.   Increase your low-carb veggies and/or protein if needed to feel full.

## 2016-01-31 ENCOUNTER — Encounter: Payer: Self-pay | Admitting: *Deleted

## 2016-01-31 ENCOUNTER — Observation Stay
Admission: EM | Admit: 2016-01-31 | Discharge: 2016-01-31 | Disposition: A | Discharge status: Home / Self Care | Payer: Medicaid Other | Attending: Obstetrics and Gynecology | Admitting: Obstetrics and Gynecology

## 2016-01-31 DIAGNOSIS — Z3493 Encounter for supervision of normal pregnancy, unspecified, third trimester: Secondary | ICD-10-CM | POA: Diagnosis not present

## 2016-01-31 DIAGNOSIS — Z3490 Encounter for supervision of normal pregnancy, unspecified, unspecified trimester: Secondary | ICD-10-CM

## 2016-01-31 MED ORDER — FAMOTIDINE 20 MG PO TABS
ORAL_TABLET | ORAL | Status: AC
  Administered 2016-01-31: 20 mg
  Filled 2016-01-31: qty 1

## 2016-01-31 MED ORDER — FAMOTIDINE IN NACL 20-0.9 MG/50ML-% IV SOLN: 20.0000 mg | Freq: Once | INTRAVENOUS | Status: DC

## 2016-01-31 NOTE — OB Triage Note (Signed)
Recvd pt from ED. Pt c/o contractions starting at 2:00 am. Pt states she had intercourse at midnight. Pt rates the contractions an 8 out of 10. Feeling baby move ok, no vaginal bleeding or leaking of fluid. Hooked up to EFM.

## 2016-01-31 NOTE — Discharge Instructions (Signed)
Come back if:  Big gush of fluid Temp over 100.4 Heavy vaginal bleeding Decreased fetal movement Contractions every 3-5 min lasting at least 2 hours   Stay well hydrated and get plenty of rest!

## 2016-02-02 ENCOUNTER — Encounter: Payer: Self-pay | Admitting: *Deleted

## 2016-02-02 ENCOUNTER — Observation Stay
Admission: EM | Admit: 2016-02-02 | Discharge: 2016-02-02 | Disposition: A | Discharge status: Home / Self Care | Payer: Medicaid Other | Attending: Obstetrics and Gynecology | Admitting: Obstetrics and Gynecology

## 2016-02-02 DIAGNOSIS — O471 False labor at or after 37 completed weeks of gestation: Secondary | ICD-10-CM | POA: Diagnosis present

## 2016-02-02 DIAGNOSIS — Z3A37 37 weeks gestation of pregnancy: Secondary | ICD-10-CM | POA: Diagnosis not present

## 2016-02-02 MED ORDER — ZOLPIDEM TARTRATE 5 MG PO TABS: 5.0000 mg | ORAL_TABLET | Freq: Every evening | ORAL | 1 refills | Status: DC | PRN

## 2016-02-02 MED ORDER — ZOLPIDEM TARTRATE 5 MG PO TABS
ORAL_TABLET | ORAL | Status: AC
Start: 2016-02-02 — End: 2016-02-02
  Administered 2016-02-02: 5 mg via ORAL
  Filled 2016-02-02: qty 1

## 2016-02-02 MED ORDER — ZOLPIDEM TARTRATE 5 MG PO TABS
5.0000 mg | ORAL_TABLET | Freq: Every evening | ORAL | Status: DC | PRN
  Administered 2016-02-02: 5 mg via ORAL

## 2016-02-02 NOTE — Final Progress Note (Signed)
Physician Final Progress Note  Patient ID: Donna NeighborVanessa M Pennick MRN: [id number] DOB/AGE: 1986-05-21 30 y.o.  Admit date: 02/02/2016 Admitting provider: Vena AustriaAndreas Sarabeth Benton, MD Discharge date: 02/02/2016   Admission Diagnoses: contractions  Discharge Diagnoses:  Braxton Hicks contractions  Consults: None  Significant Findings/ Diagnostic Studies: none  Procedures: NST reactive 140, moderate variability, +accels, no decels  Discharge Condition: good  Disposition: 01-Home or Self Care  Diet: Regular diet  Discharge Activity: Activity as tolerated  Discharge Instructions    Discharge activity:  No Restrictions    Complete by:  As directed   Discharge diet:  No restrictions    Complete by:  As directed   Fetal Kick Count:  Lie on our left side for one hour after a meal, and count the number of times your baby kicks.  If it is less than 5 times, get up, move around and drink some juice.  Repeat the test 30 minutes later.  If it is still less than 5 kicks in an hour, notify your doctor.    Complete by:  As directed   LABOR:  When conractions begin, you should start to time them from the beginning of one contraction to the beginning  of the next.  When contractions are 5 - 10 minutes apart or less and have been regular for at least an hour, you should call your health care provider.    Complete by:  As directed   No sexual activity restrictions    Complete by:  As directed   Notify physician for bleeding from the vagina    Complete by:  As directed   Notify physician for blurring of vision or spots before the eyes    Complete by:  As directed   Notify physician for chills or fever    Complete by:  As directed   Notify physician for fainting spells, "black outs" or loss of consciousness    Complete by:  As directed   Notify physician for increase in vaginal discharge    Complete by:  As directed   Notify physician for leaking of fluid    Complete by:  As directed   Notify physician for pain  or burning when urinating    Complete by:  As directed   Notify physician for pelvic pressure (sudden increase)    Complete by:  As directed   Notify physician for severe or continued nausea or vomiting    Complete by:  As directed   Notify physician for sudden gushing of fluid from the vagina (with or without continued leaking)    Complete by:  As directed   Notify physician for sudden, constant, or occasional abdominal pain    Complete by:  As directed   Notify physician if baby moving less than usual    Complete by:  As directed       Medication List    TAKE these medications   Prenatal Vitamins 0.8 MG tablet Take 1 tablet by mouth daily. Notes to patient:  Take medication as recommended during pregnancy   zolpidem 5 MG tablet Commonly known as:  AMBIEN Take 1 tablet (5 mg total) by mouth at bedtime as needed for sleep.        Follow-up Information    [facility] [place] Go in 2 day(s).   Contact information: [address] [place] [address] [phone]           Total time spent taking care of this patient: 10 minutes  Signed:  Ashwini Jago M 02/02/2016, 9:51 PM

## 2016-02-02 NOTE — OB Triage Note (Signed)
Pt. here due to contractions; contractions started 14:00 today, getting stronger. Denies sudden gush of fluid, no  vaginal bleeding noted; positive for fetal movement, sexual intercourse this morning.  U/S applied FHR-150 bpm, Toco applied - abd. Soft.

## 2016-02-02 NOTE — Discharge Instructions (Signed)
Try warm shower, ambulating, lying on Lt side and hydrate well to ease labor discomfort. Return to hospital when having regular, painful contractions, leaking or gush of fluid, vaginal bleeding or other signs of labor as discussed and per discharge instructions. Perform kick counts if concerned for decrease or absent baby movement and call OB doctor with plans to return to the hospital for monitoring.

## 2016-02-02 NOTE — Progress Notes (Signed)
Pt sitting upright in chair, no signs of distress, discharge teaching and labor precautions with PTL and kick count handout given and reviewed with pt and s/o. Ambien 5mg  given po and Rx for Ambien provided to pt per MD to take at bedtime to promote rest if needed. Pt reports still having discomfort during contractions, rates 6-7/10 while having contractions but confirms pain is not constant. Pt and s/o verbalized understanding and agreement with plan for discharge home.

## 2016-02-05 ENCOUNTER — Observation Stay
Admission: EM | Admit: 2016-02-05 | Discharge: 2016-02-05 | Disposition: A | Discharge status: Home / Self Care | Payer: No Typology Code available for payment source | Attending: Obstetrics and Gynecology | Admitting: Obstetrics and Gynecology

## 2016-02-05 DIAGNOSIS — Z041 Encounter for examination and observation following transport accident: Secondary | ICD-10-CM | POA: Diagnosis not present

## 2016-02-05 DIAGNOSIS — Z3A36 36 weeks gestation of pregnancy: Secondary | ICD-10-CM | POA: Insufficient documentation

## 2016-02-05 DIAGNOSIS — O26893 Other specified pregnancy related conditions, third trimester: Secondary | ICD-10-CM | POA: Diagnosis present

## 2016-02-05 DIAGNOSIS — O479 False labor, unspecified: Secondary | ICD-10-CM

## 2016-02-05 MED ORDER — CALCIUM CARBONATE ANTACID 500 MG PO CHEW: 2.0000 | CHEWABLE_TABLET | ORAL | Status: DC | PRN

## 2016-02-05 MED ORDER — SODIUM CHLORIDE 0.9 % IV BOLUS (SEPSIS): 1000.0000 mL | Freq: Once | INTRAVENOUS | Status: DC

## 2016-02-05 MED ORDER — ACETAMINOPHEN 325 MG PO TABS
650.0000 mg | ORAL_TABLET | ORAL | Status: DC | PRN
  Administered 2016-02-05: 650 mg via ORAL
  Filled 2016-02-05: qty 2

## 2016-02-05 NOTE — ED Notes (Signed)
Pt transferred to L&D for observation.
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
## 2016-02-05 NOTE — Discharge Summary (Signed)
Patient presented for evaluation of labor.  Patient had cervical exam by RN and this was reported to me. I reviewed her vital signs and fetal tracing, both of which were reassuring.  Patient was discharge as she was not laboring.  

## 2016-02-05 NOTE — Discharge Instructions (Signed)
Please proceed directly to labor and delivery for further monitoring. Return to the emergency department for any personally concerning symptoms.

## 2016-02-06 LAB — OB RESULTS CONSOLE GBS: STREP GROUP B AG: POSITIVE

## 2016-02-22 ENCOUNTER — Inpatient Hospital Stay
Admission: EM | Admit: 2016-02-22 | Discharge: 2016-02-24 | DRG: 775 | Disposition: A | Discharge status: Home / Self Care | Payer: Medicaid Other | Attending: Obstetrics & Gynecology | Admitting: Obstetrics & Gynecology

## 2016-02-22 ENCOUNTER — Inpatient Hospital Stay: Payer: Medicaid Other | Admitting: Anesthesiology

## 2016-02-22 DIAGNOSIS — M533 Sacrococcygeal disorders, not elsewhere classified: Secondary | ICD-10-CM | POA: Diagnosis not present

## 2016-02-22 DIAGNOSIS — Z3A38 38 weeks gestation of pregnancy: Secondary | ICD-10-CM | POA: Diagnosis not present

## 2016-02-22 DIAGNOSIS — O24420 Gestational diabetes mellitus in childbirth, diet controlled: Principal | ICD-10-CM | POA: Diagnosis present

## 2016-02-22 DIAGNOSIS — D62 Acute posthemorrhagic anemia: Secondary | ICD-10-CM | POA: Diagnosis not present

## 2016-02-22 DIAGNOSIS — M79661 Pain in right lower leg: Secondary | ICD-10-CM

## 2016-02-22 DIAGNOSIS — O99824 Streptococcus B carrier state complicating childbirth: Secondary | ICD-10-CM | POA: Diagnosis present

## 2016-02-22 DIAGNOSIS — Z3490 Encounter for supervision of normal pregnancy, unspecified, unspecified trimester: Secondary | ICD-10-CM

## 2016-02-22 DIAGNOSIS — O24415 Gestational diabetes mellitus in pregnancy, controlled by oral hypoglycemic drugs: Secondary | ICD-10-CM

## 2016-02-22 DIAGNOSIS — O9081 Anemia of the puerperium: Secondary | ICD-10-CM | POA: Diagnosis not present

## 2016-02-22 LAB — TYPE AND SCREEN
ABO/RH(D): A POS
ANTIBODY SCREEN: NEGATIVE

## 2016-02-22 LAB — CBC
HEMATOCRIT: 33.4 % — AB (ref 35.0–47.0)
HEMOGLOBIN: 10.8 g/dL — AB (ref 12.0–16.0)
MCH: 24.6 pg — AB (ref 26.0–34.0)
MCHC: 32.3 g/dL (ref 32.0–36.0)
MCV: 76.2 fL — ABNORMAL LOW (ref 80.0–100.0)
Platelets: 298 10*3/uL (ref 150–440)
RBC: 4.39 MIL/uL (ref 3.80–5.20)
RDW: 14.9 % — ABNORMAL HIGH (ref 11.5–14.5)
WBC: 13.3 10*3/uL — ABNORMAL HIGH (ref 3.6–11.0)

## 2016-02-22 LAB — GLUCOSE, CAPILLARY: Glucose-Capillary: 111 mg/dL — ABNORMAL HIGH (ref 65–99)

## 2016-02-22 MED ORDER — IBUPROFEN 600 MG PO TABS
600.0000 mg | ORAL_TABLET | Freq: Four times a day (QID) | ORAL | Status: DC
  Administered 2016-02-22 – 2016-02-24 (×9): 600 mg via ORAL
  Filled 2016-02-22 (×9): qty 1

## 2016-02-22 MED ORDER — AMMONIA AROMATIC IN INHA
RESPIRATORY_TRACT | Status: AC
  Filled 2016-02-22: qty 10

## 2016-02-22 MED ORDER — LIDOCAINE HCL (PF) 1 % IJ SOLN
INTRAMUSCULAR | Status: AC
  Filled 2016-02-22: qty 30

## 2016-02-22 MED ORDER — ROPIVACAINE HCL 2 MG/ML IJ SOLN
INTRAMUSCULAR | Status: DC | PRN
  Administered 2016-02-22: 10 mL/h via EPIDURAL

## 2016-02-22 MED ORDER — OXYTOCIN BOLUS FROM INFUSION
500.0000 mL | Freq: Once | INTRAVENOUS | Status: AC
  Administered 2016-02-22: 500 mL via INTRAVENOUS

## 2016-02-22 MED ORDER — PRENATAL MULTIVITAMIN CH
1.0000 | ORAL_TABLET | Freq: Every day | ORAL | Status: DC
  Administered 2016-02-22 – 2016-02-24 (×3): 1 via ORAL
  Filled 2016-02-22 (×4): qty 1

## 2016-02-22 MED ORDER — PHENYLEPHRINE 40 MCG/ML (10ML) SYRINGE FOR IV PUSH (FOR BLOOD PRESSURE SUPPORT): 80.0000 ug | PREFILLED_SYRINGE | INTRAVENOUS | Status: DC | PRN

## 2016-02-22 MED ORDER — ONDANSETRON HCL 4 MG/2ML IJ SOLN
4.0000 mg | INTRAMUSCULAR | Status: DC | PRN
  Administered 2016-02-22: 4 mg via INTRAVENOUS
  Filled 2016-02-22: qty 2

## 2016-02-22 MED ORDER — OXYCODONE-ACETAMINOPHEN 5-325 MG PO TABS
1.0000 | ORAL_TABLET | ORAL | Status: DC | PRN
  Administered 2016-02-22 – 2016-02-24 (×8): 1 via ORAL
  Filled 2016-02-22 (×9): qty 1

## 2016-02-22 MED ORDER — FENTANYL 2.5 MCG/ML W/ROPIVACAINE 0.2% IN NS 100 ML EPIDURAL INFUSION (ARMC-ANES)
EPIDURAL | Status: AC
  Filled 2016-02-22: qty 100

## 2016-02-22 MED ORDER — LIDOCAINE-EPINEPHRINE (PF) 1.5 %-1:200000 IJ SOLN
INTRAMUSCULAR | Status: DC | PRN
  Administered 2016-02-22: 3 mg via EPIDURAL

## 2016-02-22 MED ORDER — SODIUM CHLORIDE 0.9 % IV SOLN
2.0000 g | Freq: Once | INTRAVENOUS | Status: AC
  Administered 2016-02-22: 2 g via INTRAVENOUS

## 2016-02-22 MED ORDER — EPHEDRINE 5 MG/ML INJ: 10.0000 mg | INTRAVENOUS | Status: DC | PRN

## 2016-02-22 MED ORDER — BENZOCAINE-MENTHOL 20-0.5 % EX AERO: 1.0000 "application " | INHALATION_SPRAY | CUTANEOUS | Status: DC | PRN

## 2016-02-22 MED ORDER — LACTATED RINGERS IV SOLN: 500.0000 mL | Freq: Once | INTRAVENOUS | Status: DC

## 2016-02-22 MED ORDER — ACETAMINOPHEN 325 MG PO TABS: 650.0000 mg | ORAL_TABLET | ORAL | Status: DC | PRN

## 2016-02-22 MED ORDER — LACTATED RINGERS IV SOLN: 500.0000 mL | INTRAVENOUS | Status: DC | PRN

## 2016-02-22 MED ORDER — SIMETHICONE 80 MG PO CHEW
80.0000 mg | CHEWABLE_TABLET | ORAL | Status: DC | PRN
Start: 2016-02-22 — End: 2016-02-24
  Administered 2016-02-22: 80 mg via ORAL
  Filled 2016-02-22: qty 1

## 2016-02-22 MED ORDER — SENNOSIDES-DOCUSATE SODIUM 8.6-50 MG PO TABS
2.0000 | ORAL_TABLET | ORAL | Status: DC
  Administered 2016-02-23 – 2016-02-24 (×2): 2 via ORAL
  Filled 2016-02-22 (×2): qty 2

## 2016-02-22 MED ORDER — OXYTOCIN 40 UNITS IN LACTATED RINGERS INFUSION - SIMPLE MED
2.5000 [IU]/h | INTRAVENOUS | Status: DC
  Administered 2016-02-22: 2.5 [IU]/h via INTRAVENOUS

## 2016-02-22 MED ORDER — LACTATED RINGERS IV SOLN
INTRAVENOUS | Status: DC
  Administered 2016-02-22 (×2): via INTRAVENOUS

## 2016-02-22 MED ORDER — ONDANSETRON HCL 4 MG PO TABS: 4.0000 mg | ORAL_TABLET | ORAL | Status: DC | PRN

## 2016-02-22 MED ORDER — MISOPROSTOL 200 MCG PO TABS
ORAL_TABLET | ORAL | Status: AC
  Filled 2016-02-22: qty 4

## 2016-02-22 MED ORDER — SODIUM CHLORIDE 0.9 % IV SOLN: 1.0000 g | INTRAVENOUS | Status: DC

## 2016-02-22 MED ORDER — OXYCODONE-ACETAMINOPHEN 5-325 MG PO TABS
2.0000 | ORAL_TABLET | ORAL | Status: DC | PRN
  Administered 2016-02-24: 2 via ORAL
  Filled 2016-02-22: qty 2

## 2016-02-22 MED ORDER — DIPHENHYDRAMINE HCL 50 MG/ML IJ SOLN: 12.5000 mg | INTRAMUSCULAR | Status: DC | PRN

## 2016-02-22 MED ORDER — WITCH HAZEL-GLYCERIN EX PADS: 1.0000 "application " | MEDICATED_PAD | CUTANEOUS | Status: DC | PRN

## 2016-02-22 MED ORDER — OXYTOCIN 10 UNIT/ML IJ SOLN
INTRAMUSCULAR | Status: AC
  Filled 2016-02-22: qty 2

## 2016-02-22 MED ORDER — ONDANSETRON HCL 4 MG/2ML IJ SOLN: 4.0000 mg | Freq: Four times a day (QID) | INTRAMUSCULAR | Status: DC | PRN

## 2016-02-22 MED ORDER — DIBUCAINE 1 % RE OINT: 1.0000 "application " | TOPICAL_OINTMENT | RECTAL | Status: DC | PRN

## 2016-02-22 MED ORDER — FENTANYL 2.5 MCG/ML W/ROPIVACAINE 0.2% IN NS 100 ML EPIDURAL INFUSION (ARMC-ANES): 10.0000 mL/h | EPIDURAL | Status: DC

## 2016-02-22 MED ORDER — COCONUT OIL OIL: 1.0000 "application " | TOPICAL_OIL | Status: DC | PRN

## 2016-02-22 MED ORDER — FENTANYL 2.5 MCG/ML BUPIVACAINE 1/10 % EPIDURAL INFUSION (WH - ANES): 10.0000 mL/h | INTRAMUSCULAR | Status: DC | PRN

## 2016-02-22 MED ORDER — BUTORPHANOL TARTRATE 1 MG/ML IJ SOLN: 1.0000 mg | INTRAMUSCULAR | Status: DC | PRN

## 2016-02-22 MED ORDER — SODIUM CHLORIDE 0.9 % IV SOLN
INTRAVENOUS | Status: AC
  Administered 2016-02-22: 2 g via INTRAVENOUS
  Filled 2016-02-22: qty 2000

## 2016-02-22 MED ORDER — DIPHENHYDRAMINE HCL 25 MG PO CAPS: 25.0000 mg | ORAL_CAPSULE | Freq: Four times a day (QID) | ORAL | Status: DC | PRN

## 2016-02-22 MED ORDER — OXYTOCIN 40 UNITS IN LACTATED RINGERS INFUSION - SIMPLE MED
INTRAVENOUS | Status: AC
  Administered 2016-02-22: 500 mL via INTRAVENOUS
  Filled 2016-02-22: qty 1000

## 2016-02-22 NOTE — Anesthesia Procedure Notes (Signed)
Epidural Patient location during procedure: OB Start time: 02/22/2016 2:53 AM End time: 02/22/2016 3:46 AM  Preanesthetic Checklist Completed: patient identified, site marked, surgical consent, pre-op evaluation, timeout performed, IV checked, risks and benefits discussed and monitors and equipment checked  Epidural Patient position: sitting Prep: Betadine Patient monitoring: heart rate, continuous pulse ox and blood pressure Approach: midline Location: L4-L5 Injection technique: LOR saline  Needle:  Needle type: Tuohy  Needle gauge: 17 G Needle length: 9 cm and 9 Needle insertion depth: 8 cm Catheter type: closed end flexible Catheter size: 19 Gauge Catheter at skin depth: 12 cm Test dose: negative and 1.5% lidocaine with Epi 1:200 K  Assessment Events: blood not aspirated, injection not painful, no injection resistance, negative IV test and no paresthesia  Additional Notes   Patient tolerated the insertion well without complications.Reason for block:procedure for pain
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
## 2016-02-22 NOTE — Anesthesia Preprocedure Evaluation (Signed)
Anesthesia Evaluation  Patient identified by MRN, date of birth, ID band Patient awake    Reviewed: Allergy & Precautions, NPO status , Patient's Chart, lab work & pertinent test results  History of Anesthesia Complications Negative for: history of anesthetic complications  Airway Mallampati: II       Dental   Pulmonary neg pulmonary ROS, former smoker,           Cardiovascular negative cardio ROS       Neuro/Psych negative neurological ROS     GI/Hepatic negative GI ROS, Neg liver ROS,   Endo/Other  diabetes, Gestational  Renal/GU negative Renal ROS     Musculoskeletal negative musculoskeletal ROS (+)   Abdominal   Peds  Hematology negative hematology ROS (+)   Anesthesia Other Findings   Reproductive/Obstetrics                             Anesthesia Physical Anesthesia Plan  ASA: II  Anesthesia Plan: Epidural   Post-op Pain Management:    Induction:   Airway Management Planned:   Additional Equipment:   Intra-op Plan:   Post-operative Plan:   Informed Consent: I have reviewed the patients History and Physical, chart, labs and discussed the procedure including the risks, benefits and alternatives for the proposed anesthesia with the patient or authorized representative who has indicated his/her understanding and acceptance.     Plan Discussed with:   Anesthesia Plan Comments:         Anesthesia Quick Evaluation

## 2016-02-22 NOTE — OB Triage Note (Signed)
Patient c/o contractions that began 23:30 and since then have increase in pain. Reports ctrx every 5 mins. Reports postive fetal movement.

## 2016-02-22 NOTE — Anesthesia Postprocedure Evaluation (Signed)
Anesthesia Post Note  Patient: Donna NeighborVanessa M Aurich  Procedure(s) Performed: * No procedures listed *  Patient location during evaluation: Mother Baby Anesthesia Type: Epidural Level of consciousness: awake and alert and oriented Pain management: pain level controlled Vital Signs Assessment: post-procedure vital signs reviewed and stable Respiratory status: spontaneous breathing, nonlabored ventilation and respiratory function stable Cardiovascular status: stable Postop Assessment: no headache, no backache and epidural receding Anesthetic complications: no    Last Vitals:  Vitals:   02/22/16 1719 02/22/16 1957  BP: 120/74 133/87  Pulse:  89  Resp:  19  Temp: 36.7 C 37.3 C    Last Pain:  Vitals:   02/22/16 1957  TempSrc: Oral  PainSc:                  [name]

## 2016-02-22 NOTE — Discharge Summary (Addendum)
Obstetric Discharge Summary Reason for Admission: onset of labor Prenatal Procedures: none Intrapartum Procedures: spontaneous vaginal delivery Postpartum Procedures: none Complications-Operative and Postpartum: none Hemoglobin  Date Value Ref Range Status  02/23/2016 9.4 (L) 12.0 - 16.0 g/dL Final  16/10/960407/21/2017 9.8 g/dL Final   HCT  Date Value Ref Range Status  02/23/2016 28.7 (L) 35.0 - 47.0 % Final  12/13/2015 30 % Final   A POS/ RI/ VI/ GBS positive Physical Exam: BP (!) 153/85 (BP Location: Left Arm)   Pulse 88   Temp 98.3 F (36.8 C) (Oral)   Resp 20   Ht 5\' 6"  (1.676 Forbes)   Wt 95.3 kg (210 lb)   LMP 05/09/2015   SpO2 99%   Breastfeeding? Unknown   BMI 33.89 kg/Forbes  All blood pressures prior to this one were normal. General: alert, appears stated age and no distress/ having pain in tailbone with sitting. Lidoderm patch not helping. Lochia: appropriate Uterine Fundus: firm DVT Evaluation: Doppler study on both legs returned negative for DVT  Discharge Diagnoses: Term Pregnancy-delivered  Gestational diabetes Sacrococcygeal pain  Discharge Information: Date: 02/24/2016 Activity: pelvic rest Diet: routine   Medication List    STOP taking these medications   zolpidem 5 MG tablet Commonly known as:  AMBIEN     TAKE these medications   FUSION PLUS Caps Take 1 capsule by mouth daily.   ibuprofen 600 MG tablet Commonly known as:  ADVIL,MOTRIN Take 1 tablet (600 mg total) by mouth every 6 (six) hours.   oxyCODONE-acetaminophen 5-325 MG tablet Commonly known as:  PERCOCET/ROXICET Take 1 tablet by mouth every 4 (four) hours as needed (pain scale 4-7).   Prenatal Vitamins 0.8 MG tablet Take 1 tablet by mouth daily.      Contraception: Nexplanon thru Donna Forbes Condition: stable Discharge to: home Follow-up Information    STAEBLER, Florina OuANDREAS M, MD Follow up in 6 week(s).   Specialty:  Obstetrics and Gynecology Contact information: [address] [place] [address] [phone]        Donna ConnersGUTIERREZ, Viney Forbes, CNM. Go in 4 day(s).   Specialty:  Certified Nurse Midwife Why:  for BP check Contact information: [address] [place] [phone]           Newborn Data: Live born female/ Donna Forbes/ bottle Birth Weight:  8#5.7 oz APGAR:  9/9  Home with mother.  Donna Forbes 02/24/2016, 4:50 PM

## 2016-02-23 LAB — CBC
HEMATOCRIT: 28.7 % — AB (ref 35.0–47.0)
Hemoglobin: 9.4 g/dL — ABNORMAL LOW (ref 12.0–16.0)
MCH: 24.9 pg — AB (ref 26.0–34.0)
MCHC: 32.8 g/dL (ref 32.0–36.0)
MCV: 75.7 fL — AB (ref 80.0–100.0)
Platelets: 248 10*3/uL (ref 150–440)
RBC: 3.8 MIL/uL (ref 3.80–5.20)
RDW: 15.2 % — AB (ref 11.5–14.5)
WBC: 13.2 10*3/uL — AB (ref 3.6–11.0)

## 2016-02-23 LAB — RPR: RPR: NONREACTIVE

## 2016-02-23 MED ORDER — OXYCODONE-ACETAMINOPHEN 5-325 MG PO TABS: 1.0000 | ORAL_TABLET | ORAL | 0 refills | Status: DC | PRN

## 2016-02-23 MED ORDER — IBUPROFEN 600 MG PO TABS: 600.0000 mg | ORAL_TABLET | Freq: Four times a day (QID) | ORAL | 0 refills | Status: AC

## 2016-02-23 NOTE — Progress Notes (Signed)
RN in room to give pain meds; baby in bed with mom and mom turned away from baby on her side; RN discussed safe sleep for infants and asked if RN could swaddle baby and place back in the bassinet; pt said, "he don't like it in there, i'll roll back over so I can see him"; RN said again, "please let me swaddle and try the bassinet, if you roll over or if you move some in the bed he could fall to the floor or get underneath you"; pt said, "no, i'm tired, I would like him here"; RN reiterated that she would be willing to swaddle and place baby in bassinet at any time

## 2016-02-23 NOTE — Progress Notes (Signed)
  Subjective:  Doing well, minimal lochia, reports tail bone pain  Objective:   Blood pressure 127/62, pulse 73, temperature 97.7 F (36.5 C), temperature source Oral, resp. rate 16, height 5\' 6"  (1.676 m), weight 210 lb (95.3 kg), last menstrual period 05/09/2015, SpO2 98 %, unknown if currently breastfeeding.  General: NAD Pulmonary: no increased work of breathing Abdomen: non-distended, non-tender, fundus firm at level of umbilicus Extremities: no edema, no erythema, no tenderness  Results for orders placed or performed during the hospital encounter of 02/22/16 (from the past 72 hour(s))  CBC     Status: Abnormal   Collection Time: 02/22/16  2:36 AM  Result Value Ref Range   WBC 13.3 (H) 3.6 - 11.0 K/uL   RBC 4.39 3.80 - 5.20 MIL/uL   Hemoglobin 10.8 (L) 12.0 - 16.0 g/dL   HCT 16.133.4 (L) 09.635.0 - 04.547.0 %   MCV 76.2 (L) 80.0 - 100.0 fL   MCH 24.6 (L) 26.0 - 34.0 pg   MCHC 32.3 32.0 - 36.0 g/dL   RDW 40.914.9 (H) 81.111.5 - 91.414.5 %   Platelets 298 150 - 440 K/uL  Type and screen [facility]     Status: None   Collection Time: 02/22/16  2:36 AM  Result Value Ref Range   ABO/RH(D) A POS    Antibody Screen NEG    Sample Expiration 02/25/2016   RPR     Status: None   Collection Time: 02/22/16  2:36 AM  Result Value Ref Range   RPR Ser Ql Non Reactive Non Reactive    Comment: (NOTE) Performed At: [facility] [address] [place], [place] [id number] Mila HomerHancock William F MD [id number]:[phone]   Glucose, capillary     Status: Abnormal   Collection Time: 02/22/16  5:43 AM  Result Value Ref Range   Glucose-Capillary 111 (H) 65 - 99 mg/dL  CBC     Status: Abnormal   Collection Time: 02/23/16  5:59 AM  Result Value Ref Range   WBC 13.2 (H) 3.6 - 11.0 K/uL   RBC 3.80 3.80 - 5.20 MIL/uL   Hemoglobin 9.4 (L) 12.0 - 16.0 g/dL   HCT 52.828.7 (L) 41.335.0 - 24.447.0 %   MCV 75.7 (L) 80.0 - 100.0 fL   MCH 24.9 (L) 26.0 - 34.0 pg   MCHC 32.8 32.0 - 36.0 g/dL   RDW 01.015.2 (H) 27.211.5 -  14.5 %   Platelets 248 150 - 440 K/uL    Assessment:   30 y.o. [id number] postpartum day #1 TSVD  Plan:    1) Acute blood loss anemia - hemodynamically stable and asymptomatic - po ferrous sulfate  2) --/--/A POS (09/30 0236) / Rubella Immune (03/06 0000) / Varicella Immune  3) TDAP up to date  4) Bottle/Nexplanon  5) Disposition anticipate discharge PPD1-2 pending peds (only received 1 dose of ampicillin prior to delivery)

## 2016-02-24 ENCOUNTER — Inpatient Hospital Stay: Payer: Medicaid Other

## 2016-02-24 DIAGNOSIS — O24415 Gestational diabetes mellitus in pregnancy, controlled by oral hypoglycemic drugs: Secondary | ICD-10-CM

## 2016-02-24 DIAGNOSIS — M533 Sacrococcygeal disorders, not elsewhere classified: Secondary | ICD-10-CM | POA: Diagnosis not present

## 2016-02-24 MED ORDER — FUSION PLUS PO CAPS: 1.0000 | ORAL_CAPSULE | Freq: Every day | ORAL | 3 refills | Status: DC

## 2016-02-24 MED ORDER — LIDOCAINE 5 % EX PTCH
1.0000 | MEDICATED_PATCH | CUTANEOUS | Status: DC
  Administered 2016-02-24: 1 via TRANSDERMAL
  Filled 2016-02-24: qty 1

## 2016-02-24 MED ORDER — FERROUS FUMARATE 324 (106 FE) MG PO TABS
1.0000 | ORAL_TABLET | Freq: Every day | ORAL | Status: DC
  Administered 2016-02-24: 106 mg via ORAL
  Filled 2016-02-24: qty 1

## 2016-02-24 NOTE — Progress Notes (Signed)
Patient discharged home with infant. Vital signs stable, bleeding within normal limits, uterus firm. Discharge instructions, prescriptions, and follow up appointment given to and reviewed with patient. Patient verbalized understanding, all questions answered. Escorted in wheelchair by nursing. Mennie Spiller, RN     

## 2016-02-24 NOTE — Discharge Instructions (Signed)
Vaginal Delivery, Care After Refer to this sheet in the next few weeks. These discharge instructions provide you with information on caring for yourself after delivery. Your health care provider may also give you specific instructions. Your treatment has been planned according to the most current medical practices available, but problems sometimes occur. Call your health care provider if you have any problems or questions after you go home. HOME CARE INSTRUCTIONS  Take over-the-counter or prescription medicines only as directed by your health care provider or pharmacist.  Do not drink alcohol, especially if you are breastfeeding or taking medicine to relieve pain.  Do not chew or smoke tobacco.  Do not use illegal drugs.  Continue to use good perineal care. Good perineal care includes:  Wiping your perineum from front to back.  Keeping your perineum clean.  Do not use tampons or douche or have intercourse for 6 weeks  Shower, wash your hair, and take tub baths as directed by your health care provider.  Wear a well-fitting bra that provides breast support.  Eat healthy foods.  Drink enough fluids to keep your urine clear or pale yellow.  Eat high-fiber foods such as whole grain cereals and breads, brown rice, beans, and fresh fruits and vegetables every day. These foods may help prevent or relieve constipation.  Follow your health care provider's recommendations regarding resumption of activities such as climbing stairs, driving, lifting, exercising, or traveling. No heavy lifting or strenuous exercise for 6 weeks. No driving while taking percocet.  Talk to your health care provider about resuming sexual activities. Resumption of sexual activities is dependent upon your risk of infection, your rate of healing, and your comfort and desire to resume sexual activity.  Try to have someone help you with your household activities and your newborn for at least a few days after you leave the  hospital.  Rest as much as possible. Try to rest or take a nap when your newborn is sleeping.  Increase your activities gradually.  Keep all of your scheduled postpartum appointments. It is very important to keep your scheduled follow-up appointments. At these appointments, your health care provider will be checking to make sure that you are healing physically and emotionally. SEEK MEDICAL CARE IF:   You are passing large clots from your vagina. Save any clots to show your health care provider.  You have a foul smelling discharge from your vagina.  You have trouble urinating.  You are urinating frequently.  You have pain when you urinate.  You have a change in your bowel movements.  You have increasing redness, pain, or swelling near your vaginal incision (episiotomy) or vaginal tear.  You have pus draining from your episiotomy or vaginal tear.  Your episiotomy or vaginal tear is separating.  You have painful, hard, or reddened breasts.  You have a severe headache.  You have blurred vision or see spots.  You feel sad or depressed.  You have thoughts of hurting yourself or your newborn.  You have questions about your care, the care of your newborn, or medicines.  You are dizzy or light-headed.  You have a rash.  You have nausea or vomiting.  You were breastfeeding and have not had a menstrual period within 12 weeks after you stopped breastfeeding.  You are not breastfeeding and have not had a menstrual period by the 12th week after delivery.  You have a fever of 100.5 or more SEEK IMMEDIATE MEDICAL CARE IF:   You have persistent pain.  You have chest pain.  You have shortness of breath.  You faint.  You have leg pain.  You have stomach pain.  Your vaginal bleeding saturates two or more sanitary pads in 1 hour.   This information is not intended to replace advice given to you by your health care provider. Make sure you discuss any questions you have  with your health care provider.   Document Released: 05/08/2000 Document Revised: 01/30/2015 Document Reviewed: 01/06/2012 Elsevier Interactive Patient Education [employer].

## 2016-02-24 NOTE — Progress Notes (Signed)
Post Partum Day 2 Subjective: Complains of tailbone pain, lower abdominal cramping and now pain in right calf. No longer breast feeding. Bottle feeding. Tailbone pain and cramping effectively relieved with Percocet and Motrin.. Can't sit and difficulty moving. Feet swollen.  Objective: Blood pressure 132/90, pulse 86, temperature 98 F (36.7 C), temperature source Oral, resp. rate 18, height 5\' 6"  (1.676 m), weight 95.3 kg (210 lb), last menstrual period 05/09/2015, SpO2 99 %, unknown if currently breastfeeding.  Physical Exam:  General: alert, cooperative and appears uncomfortable, moving slowly in and out of bed Lochia: appropriate Uterine Fundus: firm/ U-2/ML/NT DVT Evaluation: posterior right calf tenderness to palpation and possible cord Left calf NT Bilateral ankle and calf tenderness.     Recent Labs  02/22/16 0236 02/23/16 0559  HGB 10.8* 9.4*  HCT 33.4* 28.7*  WBC 13.3* 13.2*  PLT 298 248   CBG this AM 111 Assessment/Plan: PPD #2 Right calf tenderness and possible cord. R/O DVT  Doppler on lower extremities Sacrococcygeal pain s/p delivery  hEAT PAD TO AREA  Lidoderm patch  Continue percocet and Motrin Acute blood loss anemia  Vitamin and Fe A POS/RI/ VI/ GBS postive Bottle? Nexplanon    LOS: 2 days   Adolf Ormiston 02/24/2016, 10:35 AM

## 2017-02-16 ENCOUNTER — Encounter: Payer: Self-pay | Admitting: Emergency Medicine

## 2017-02-16 ENCOUNTER — Ambulatory Visit
Admission: EM | Admit: 2017-02-16 | Discharge: 2017-02-16 | Disposition: A | Discharge status: Home / Self Care | Payer: Self-pay | Attending: Emergency Medicine | Admitting: Emergency Medicine

## 2017-02-16 DIAGNOSIS — A09 Infectious gastroenteritis and colitis, unspecified: Secondary | ICD-10-CM

## 2017-02-16 DIAGNOSIS — R748 Abnormal levels of other serum enzymes: Secondary | ICD-10-CM

## 2017-02-16 DIAGNOSIS — R110 Nausea: Secondary | ICD-10-CM

## 2017-02-16 DIAGNOSIS — R740 Nonspecific elevation of levels of transaminase and lactic acid dehydrogenase [LDH]: Secondary | ICD-10-CM

## 2017-02-16 DIAGNOSIS — R197 Diarrhea, unspecified: Secondary | ICD-10-CM

## 2017-02-16 LAB — GASTROINTESTINAL PANEL BY PCR, STOOL (REPLACES STOOL CULTURE)

## 2017-02-16 LAB — C DIFFICILE QUICK SCREEN W PCR REFLEX
C Diff antigen: NEGATIVE
C Diff interpretation: NOT DETECTED
C Diff toxin: NEGATIVE

## 2017-02-16 LAB — COMPREHENSIVE METABOLIC PANEL
ALT: 318 U/L — ABNORMAL HIGH (ref 14–54)
AST: 213 U/L — ABNORMAL HIGH (ref 15–41)
Albumin: 4.4 g/dL (ref 3.5–5.0)
Alkaline Phosphatase: 113 U/L (ref 38–126)
Anion gap: 9 (ref 5–15)
BUN: 10 mg/dL (ref 6–20)
CO2: 25 mmol/L (ref 22–32)
Calcium: 9.4 mg/dL (ref 8.9–10.3)
Chloride: 104 mmol/L (ref 101–111)
Creatinine, Ser: 0.62 mg/dL (ref 0.44–1.00)
GFR calc Af Amer: >60 mL/min (ref >60)
GFR calc non Af Amer: >60 mL/min (ref >60)
Glucose, Bld: 101 mg/dL — ABNORMAL HIGH (ref 65–99)
Potassium: 3.6 mmol/L (ref 3.5–5.1)
Sodium: 138 mmol/L (ref 135–145)
Total Bilirubin: 0.6 mg/dL (ref 0.3–1.2)
Total Protein: 8.8 g/dL — ABNORMAL HIGH (ref 6.5–8.1)

## 2017-02-16 LAB — CBC WITH DIFFERENTIAL/PLATELET
Basophils Absolute: 0 10*3/uL (ref 0–0.1)
Basophils Relative: 0 %
Eosinophils Absolute: 0.2 10*3/uL (ref 0–0.7)
Eosinophils Relative: 2 %
HCT: 38 % (ref 35.0–47.0)
Hemoglobin: 12.7 g/dL (ref 12.0–16.0)
Lymphocytes Relative: 13 %
Lymphs Abs: 1.3 10*3/uL (ref 1.0–3.6)
MCH: 26 pg (ref 26.0–34.0)
MCHC: 33.4 g/dL (ref 32.0–36.0)
MCV: 77.8 fL — ABNORMAL LOW (ref 80.0–100.0)
Monocytes Absolute: 0.7 10*3/uL (ref 0.2–0.9)
Monocytes Relative: 7 %
Neutro Abs: 7.7 10*3/uL — ABNORMAL HIGH (ref 1.4–6.5)
Neutrophils Relative %: 78 %
Platelets: 362 10*3/uL (ref 150–440)
RBC: 4.89 MIL/uL (ref 3.80–5.20)
RDW: 13.4 % (ref 11.5–14.5)
WBC: 9.9 10*3/uL (ref 3.6–11.0)

## 2017-02-16 LAB — LIPASE, BLOOD: Lipase: 29 U/L (ref 11–51)

## 2017-02-16 MED ORDER — TRIAMCINOLONE ACETONIDE 0.025 % EX CREA: 1.0000 "application " | TOPICAL_CREAM | Freq: Two times a day (BID) | CUTANEOUS | 0 refills | Status: AC

## 2017-02-16 MED ORDER — SULFAMETHOXAZOLE-TRIMETHOPRIM 800-160 MG PO TABS: 1.0000 | ORAL_TABLET | Freq: Two times a day (BID) | ORAL | 0 refills | Status: AC
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
## 2017-02-16 NOTE — ED Triage Notes (Signed)
Patient c/o diarrhea since last Tuesday.  Patient was seen at [facility] on Friday and was given medicine.  Patient reports that she has lost 7lb.  Patient reports ongoing N/V/D.  Patient also reports stomach pain started on Monday.

## 2017-06-08 IMAGING — US US OB TRANSVAGINAL
1 series · 14 of 28 positions shown · non-contrast
Comparison: None.

CLINICAL DATA: Pelvic pain and pressure for 4 days. Positive urine
pregnancy test.

EXAM:
OBSTETRIC <14 WK US AND TRANSVAGINAL OB US
TECHNIQUE: Both transabdominal and transvaginal ultrasound examinations were
performed for complete evaluation of the gestation as well as the
maternal uterus, adnexal regions, and pelvic cul-de-sac.
Transvaginal technique was performed to assess early pregnancy.

[Series 1: us ob transvaginal · 0.23mm/px · 14 of 98 slices shown]
[im 4/98]
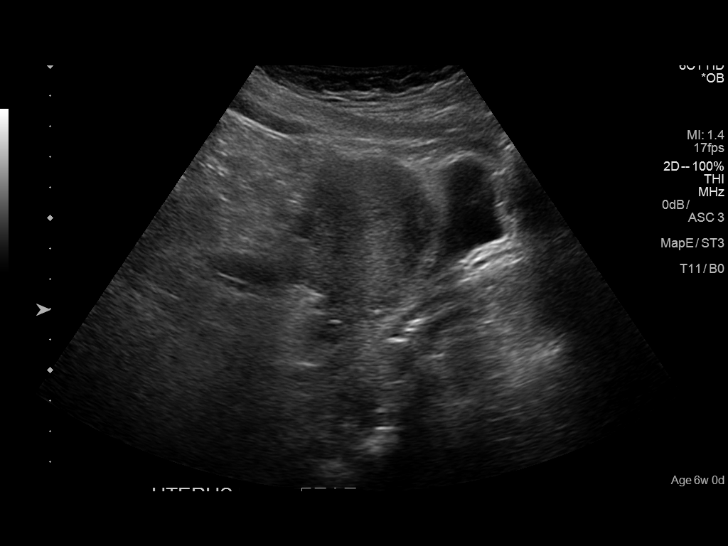
[im 11/98]
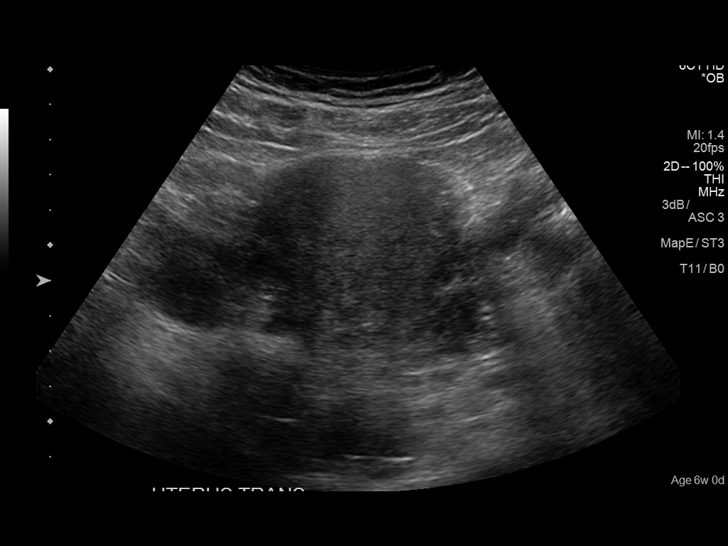
[im 18/98]
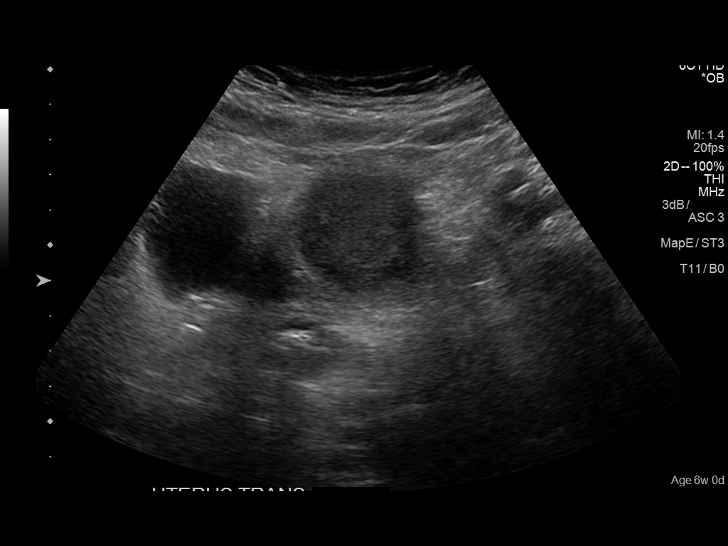
[im 26/98]
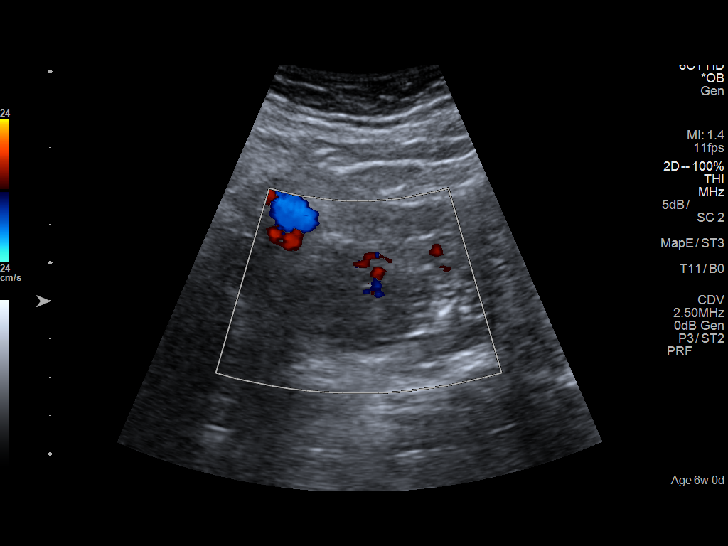
[im 33/98]
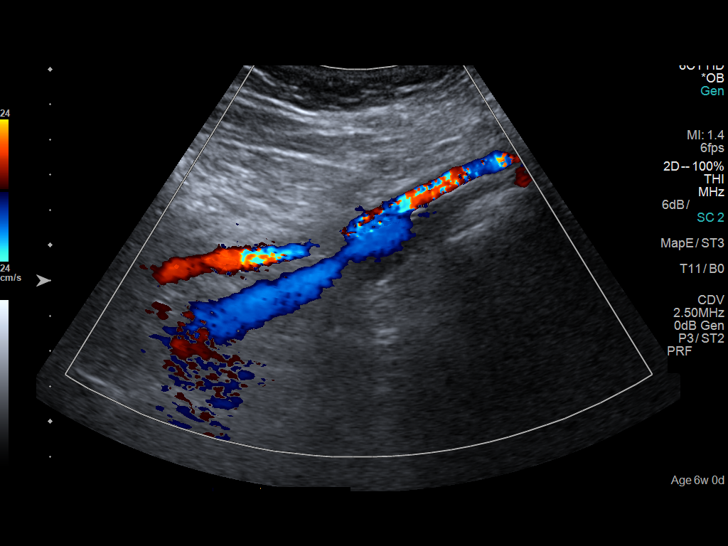
[im 40/98]
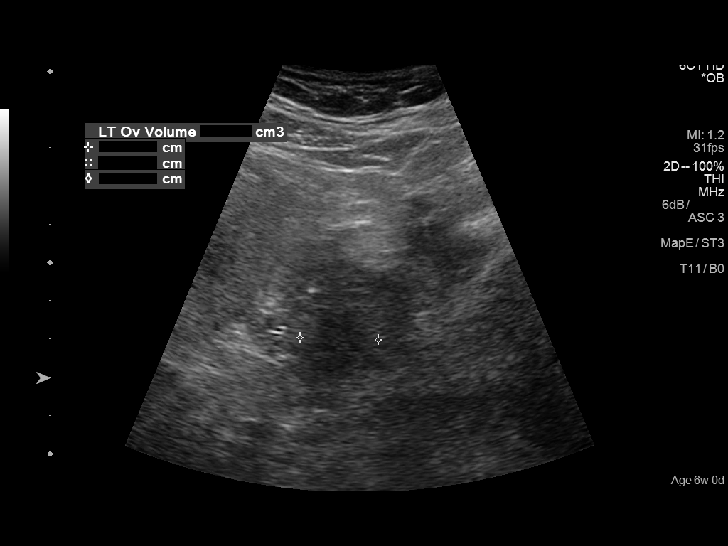
[im 47/98]
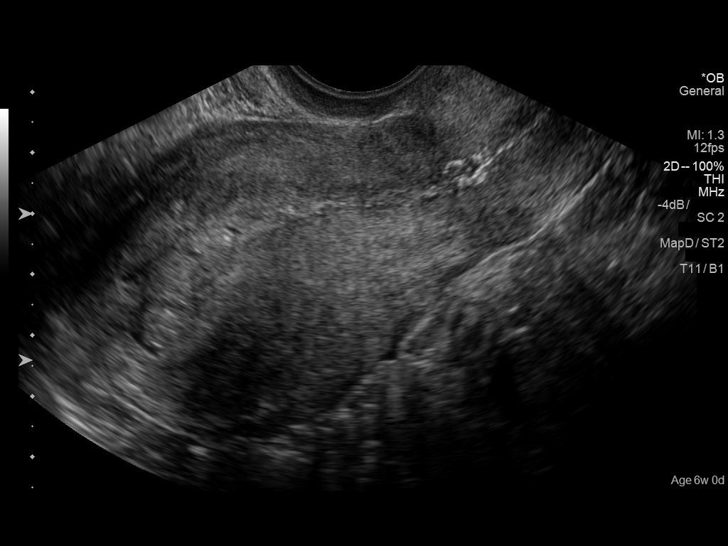
[im 54/98]
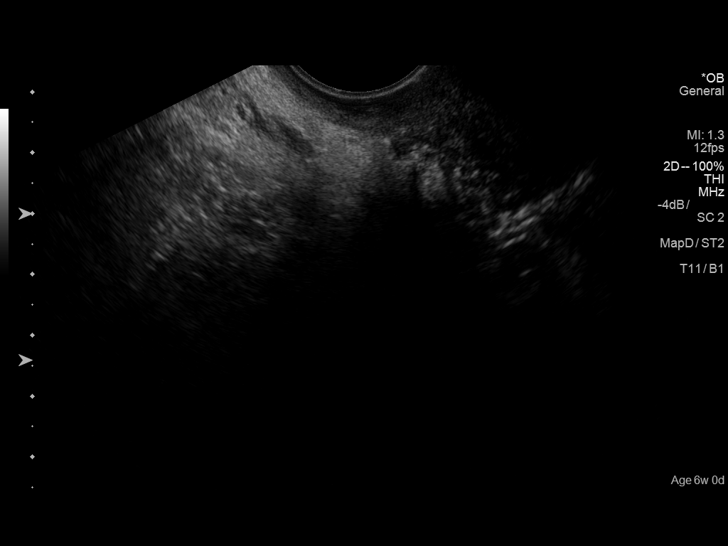
[im 62/98]
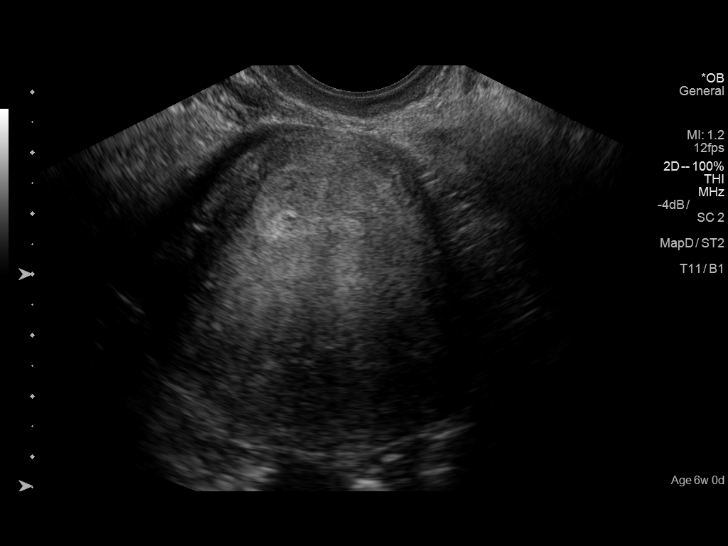
[im 69/98]
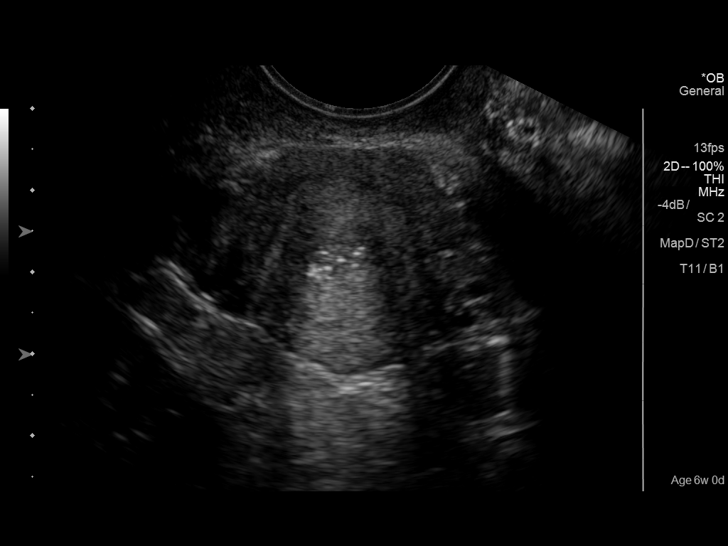
[im 76/98]
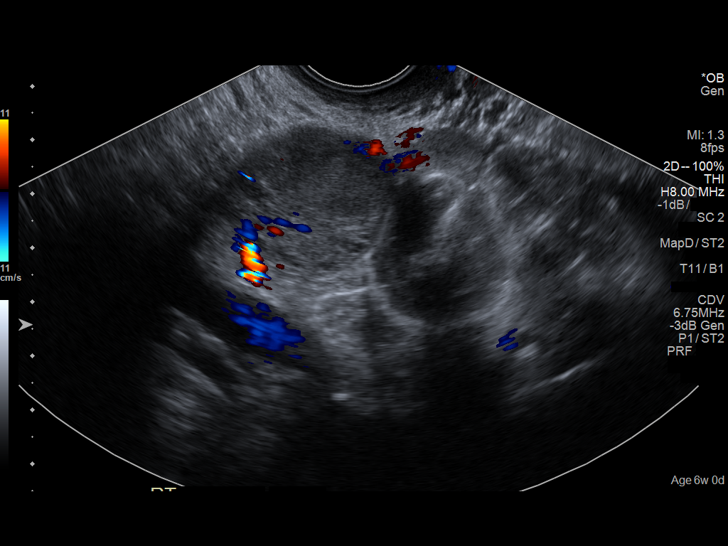
[im 83/98]
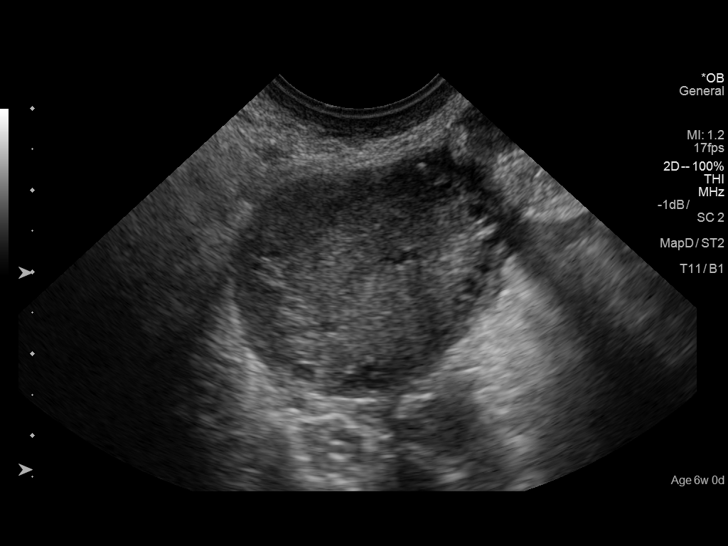
[im 90/98]
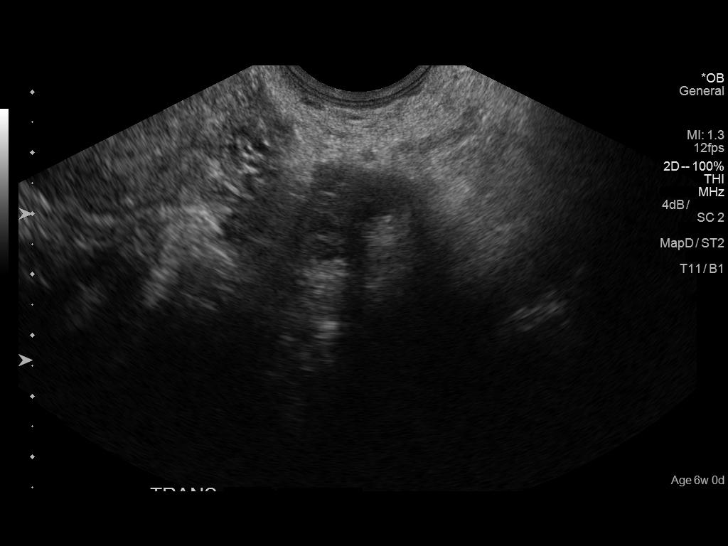
[im 98/98]
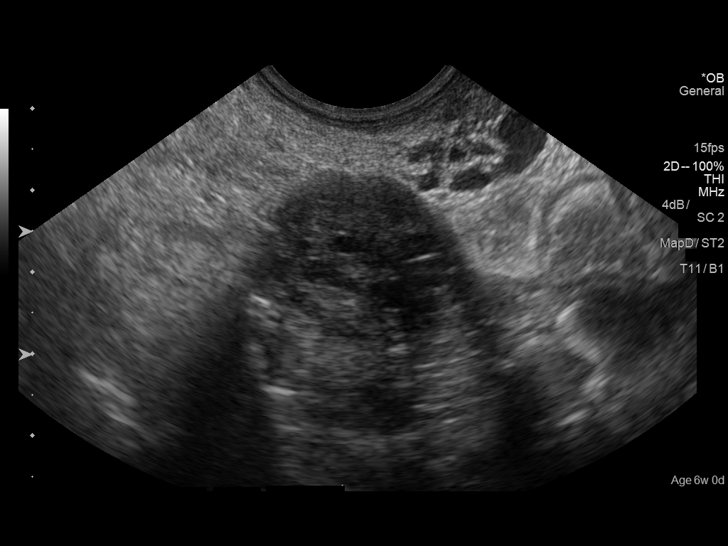

[14 of 28 positions shown; findings below may reference images not displayed]

FINDINGS: Intrauterine gestational sac: Visualized/normal in shape.

Yolk sac:  Not visualized

Embryo:  Not visualized

Cardiac Activity: Not visualized

Heart Rate: Not detected

MSD: 5  mm   5 w   2  d

Subchorionic hemorrhage:  None visualized.

Maternal uterus/adnexae: Very small intrauterine gestational sac
visualized in the fundus. Mean sac diameter only 5 mm correlating
with a 5 week 2 day gestational age. No visualized subchorionic
hemorrhage. Ovaries appear normal. Trace pelvic free fluid, likely
physiologic.
IMPRESSION: Very small early intrauterine gestational sac compatible with a 5
week 2 day IUP by mean sac diameter as above. No complicating
feature by ultrasound.

## 2017-12-30 ENCOUNTER — Emergency Department: Payer: 59

## 2017-12-30 ENCOUNTER — Emergency Department
Admission: EM | Admit: 2017-12-30 | Discharge: 2017-12-31 | Disposition: A | Discharge status: Home / Self Care | Payer: 59 | Attending: Emergency Medicine | Admitting: Emergency Medicine

## 2017-12-30 ENCOUNTER — Encounter: Payer: Self-pay | Admitting: Emergency Medicine

## 2017-12-30 DIAGNOSIS — N200 Calculus of kidney: Secondary | ICD-10-CM | POA: Diagnosis not present

## 2017-12-30 DIAGNOSIS — M545 Low back pain: Secondary | ICD-10-CM | POA: Diagnosis present

## 2017-12-30 DIAGNOSIS — R350 Frequency of micturition: Secondary | ICD-10-CM | POA: Insufficient documentation

## 2017-12-30 DIAGNOSIS — N10 Acute tubulo-interstitial nephritis: Secondary | ICD-10-CM | POA: Diagnosis not present

## 2017-12-30 DIAGNOSIS — Z79899 Other long term (current) drug therapy: Secondary | ICD-10-CM | POA: Diagnosis not present

## 2017-12-30 DIAGNOSIS — Z87891 Personal history of nicotine dependence: Secondary | ICD-10-CM | POA: Insufficient documentation

## 2017-12-30 DIAGNOSIS — N12 Tubulo-interstitial nephritis, not specified as acute or chronic: Secondary | ICD-10-CM

## 2017-12-30 LAB — URINALYSIS, COMPLETE (UACMP) WITH MICROSCOPIC
Bacteria, UA: NONE SEEN
Bilirubin Urine: NEGATIVE
Glucose, UA: NEGATIVE mg/dL
Ketones, ur: NEGATIVE mg/dL
Nitrite: NEGATIVE
PROTEIN: NEGATIVE mg/dL
Specific Gravity, Urine: 1.013 (ref 1.005–1.030)
pH: 6 (ref 5.0–8.0)

## 2017-12-30 LAB — POCT PREGNANCY, URINE: PREG TEST UR: NEGATIVE

## 2017-12-30 MED ORDER — METHOCARBAMOL 500 MG PO TABS
500.0000 mg | ORAL_TABLET | Freq: Once | ORAL | Status: AC
  Administered 2017-12-30: 500 mg via ORAL
  Filled 2017-12-30: qty 1

## 2017-12-30 MED ORDER — CEFTRIAXONE SODIUM 1 G IJ SOLR
1.0000 g | Freq: Once | INTRAMUSCULAR | Status: AC
Start: 2017-12-31 — End: 2017-12-31
  Administered 2017-12-31: 1 g via INTRAMUSCULAR
  Filled 2017-12-30: qty 10

## 2017-12-30 MED ORDER — TRAMADOL HCL 50 MG PO TABS
50.0000 mg | ORAL_TABLET | Freq: Once | ORAL | Status: AC
  Administered 2017-12-30: 50 mg via ORAL
  Filled 2017-12-30: qty 1

## 2017-12-30 NOTE — ED Triage Notes (Signed)
Pt c/o generalized back pain x2 days, described as "pulling" feeling. Pt denies urinary symptoms as well as injury.
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
## 2017-12-31 MED ORDER — CEPHALEXIN 500 MG PO CAPS: 500.0000 mg | ORAL_CAPSULE | Freq: Three times a day (TID) | ORAL | 0 refills | Status: AC

## 2019-01-26 ENCOUNTER — Other Ambulatory Visit: Payer: Self-pay | Admitting: *Deleted

## 2019-01-26 DIAGNOSIS — Z20822 Contact with and (suspected) exposure to covid-19: Secondary | ICD-10-CM

## 2019-01-27 LAB — NOVEL CORONAVIRUS, NAA: SARS-CoV-2, NAA: NOT DETECTED

## 2019-02-27 ENCOUNTER — Other Ambulatory Visit: Payer: Self-pay

## 2019-02-27 DIAGNOSIS — Z20822 Contact with and (suspected) exposure to covid-19: Secondary | ICD-10-CM

## 2019-02-28 LAB — NOVEL CORONAVIRUS, NAA: SARS-CoV-2, NAA: NOT DETECTED

## 2019-03-22 ENCOUNTER — Other Ambulatory Visit: Payer: Self-pay

## 2019-03-22 DIAGNOSIS — Z20822 Contact with and (suspected) exposure to covid-19: Secondary | ICD-10-CM

## 2019-03-23 LAB — NOVEL CORONAVIRUS, NAA: SARS-CoV-2, NAA: NOT DETECTED

## 2020-01-02 ENCOUNTER — Encounter: Payer: Self-pay | Admitting: Intensive Care

## 2020-01-02 ENCOUNTER — Other Ambulatory Visit: Payer: Self-pay

## 2020-01-02 ENCOUNTER — Emergency Department
Admission: EM | Admit: 2020-01-02 | Discharge: 2020-01-02 | Disposition: A | Discharge status: Home / Self Care | Payer: HRSA Program | Attending: Emergency Medicine | Admitting: Emergency Medicine

## 2020-01-02 ENCOUNTER — Emergency Department: Payer: HRSA Program

## 2020-01-02 DIAGNOSIS — R519 Headache, unspecified: Secondary | ICD-10-CM | POA: Diagnosis not present

## 2020-01-02 DIAGNOSIS — R0602 Shortness of breath: Secondary | ICD-10-CM | POA: Diagnosis present

## 2020-01-02 DIAGNOSIS — Z5321 Procedure and treatment not carried out due to patient leaving prior to being seen by health care provider: Secondary | ICD-10-CM | POA: Diagnosis not present

## 2020-01-02 DIAGNOSIS — R079 Chest pain, unspecified: Secondary | ICD-10-CM | POA: Insufficient documentation

## 2020-01-02 DIAGNOSIS — U071 COVID-19: Secondary | ICD-10-CM | POA: Diagnosis not present

## 2020-01-02 DIAGNOSIS — R509 Fever, unspecified: Secondary | ICD-10-CM | POA: Insufficient documentation

## 2020-01-02 HISTORY — DX: Unspecified asthma, uncomplicated: J45.909

## 2020-01-02 HISTORY — DX: COVID-19: U07.1

## 2020-01-02 LAB — TROPONIN I (HIGH SENSITIVITY): Troponin I (High Sensitivity): <2 ng/L (ref <18)

## 2020-01-02 LAB — BASIC METABOLIC PANEL
Anion gap: 11 (ref 5–15)
BUN: 8 mg/dL (ref 6–20)
CO2: 24 mmol/L (ref 22–32)
Calcium: 9.1 mg/dL (ref 8.9–10.3)
Chloride: 103 mmol/L (ref 98–111)
Creatinine, Ser: 0.6 mg/dL (ref 0.44–1.00)
GFR calc Af Amer: >60 mL/min (ref >60)
GFR calc non Af Amer: >60 mL/min (ref >60)
Glucose, Bld: 115 mg/dL — ABNORMAL HIGH (ref 70–99)
Potassium: 3.2 mmol/L — ABNORMAL LOW (ref 3.5–5.1)
Sodium: 138 mmol/L (ref 135–145)

## 2020-01-02 LAB — CBC
HCT: 38.2 % (ref 36.0–46.0)
Hemoglobin: 12.3 g/dL (ref 12.0–15.0)
MCH: 26.6 pg (ref 26.0–34.0)
MCHC: 32.2 g/dL (ref 30.0–36.0)
MCV: 82.7 fL (ref 80.0–100.0)
Platelets: 269 10*3/uL (ref 150–400)
RBC: 4.62 MIL/uL (ref 3.87–5.11)
RDW: 13.3 % (ref 11.5–15.5)
WBC: 6.8 10*3/uL (ref 4.0–10.5)
nRBC: 0 % (ref 0.0–0.2)

## 2020-01-02 NOTE — ED Triage Notes (Signed)
Patient diagnosed with COVID this AM. Reports feeling sob, chest tightness, headache, and fever.

## 2020-01-04 ENCOUNTER — Telehealth: Payer: Self-pay | Admitting: Emergency Medicine

## 2020-01-04 NOTE — Telephone Encounter (Signed)
Called patient due to lwot to inquire about condition and follow up plans. She says she feels okay today.  I told her to follow up with pcp and that labs showed low potassium.  I also told her she should return if she feel she can't breath well enough.

## 2020-02-02 IMAGING — CT CT RENAL STONE PROTOCOL
3 of 4 series · 9 of 46 positions shown, 16 images · non-contrast
Comparison: Pelvic ultrasound performed 06/26/2015

CLINICAL DATA: Acute onset of generalized back pain.

EXAM:
CT ABDOMEN AND PELVIS WITHOUT CONTRAST
TECHNIQUE: Multidetector CT imaging of the abdomen and pelvis was performed
following the standard protocol without IV contrast.

[Series 4: lung bases · axial · 0.67mm/px · z∈[+56,+131]mm · 5 of 23 slices shown, 10 images]
[im 4/23  soft-tissue]
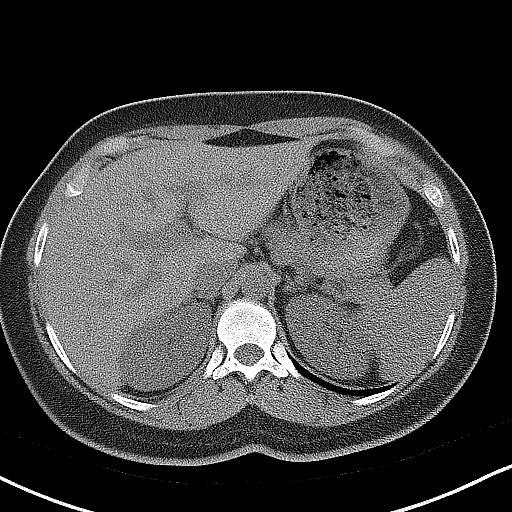
[im 4/23  bone]
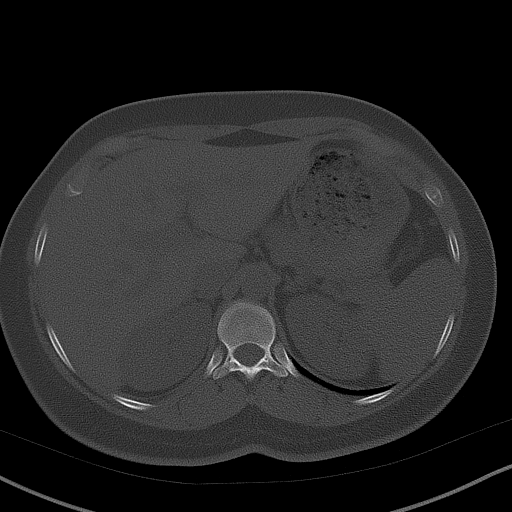
[im 8/23  soft-tissue]
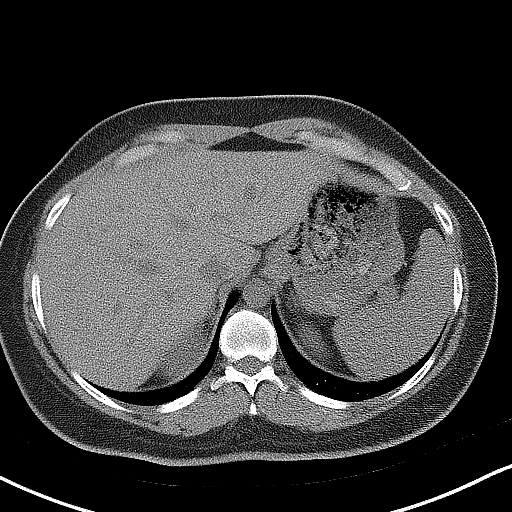
[im 8/23  lung]
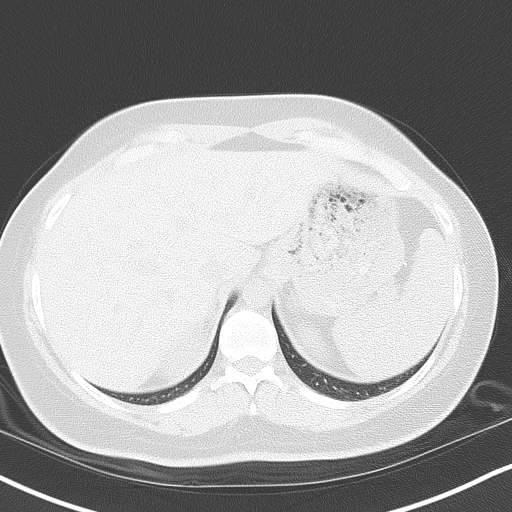
[im 12/23  soft-tissue]
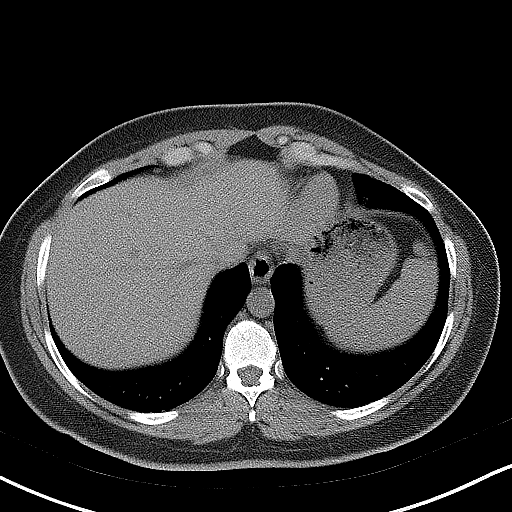
[im 12/23  lung]
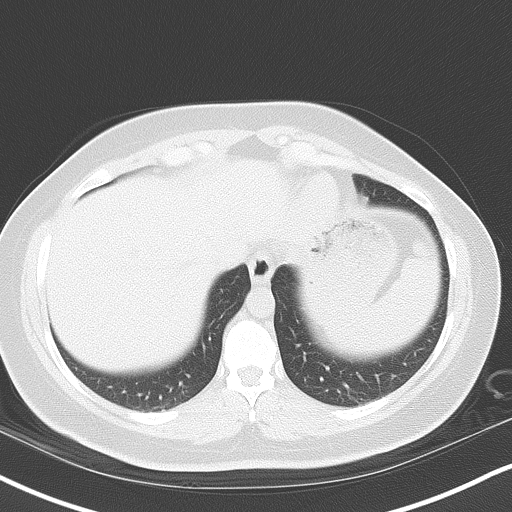
[im 15/23  soft-tissue]
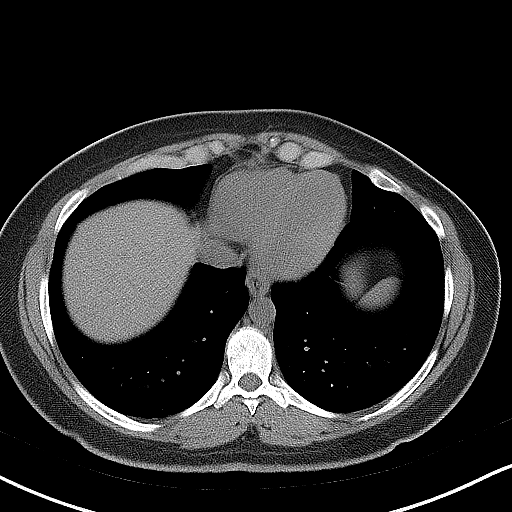
[im 15/23  lung]
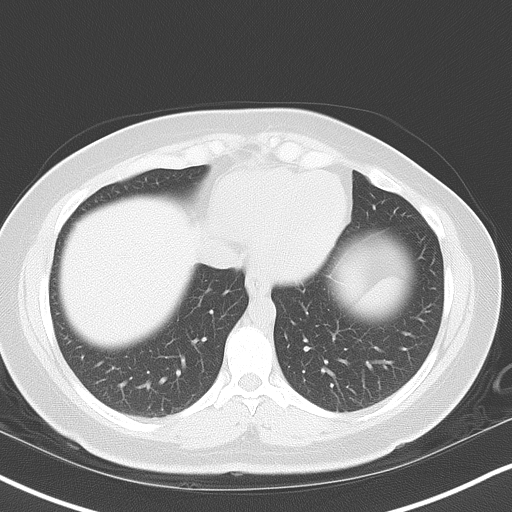
[im 19/23  soft-tissue]
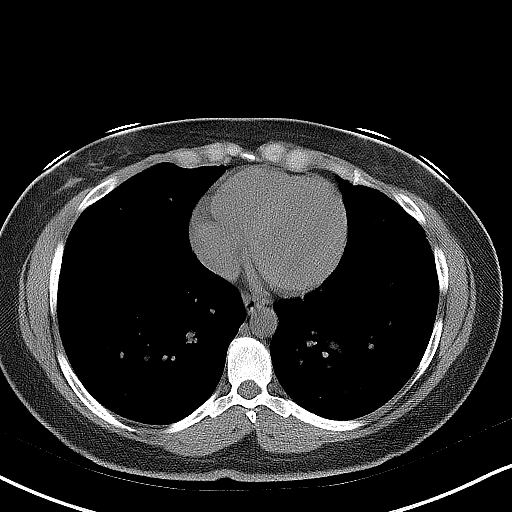
[im 19/23  lung]
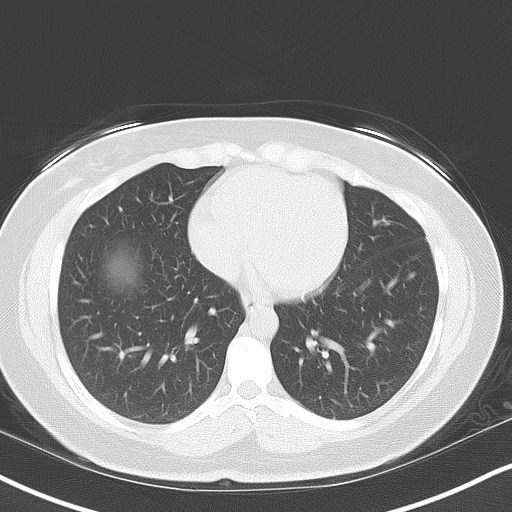

[Series 5: coronal · coronal · 0.67mm/px · 3 of 137 slices shown, 4 images]
[im 46/137  soft-tissue]
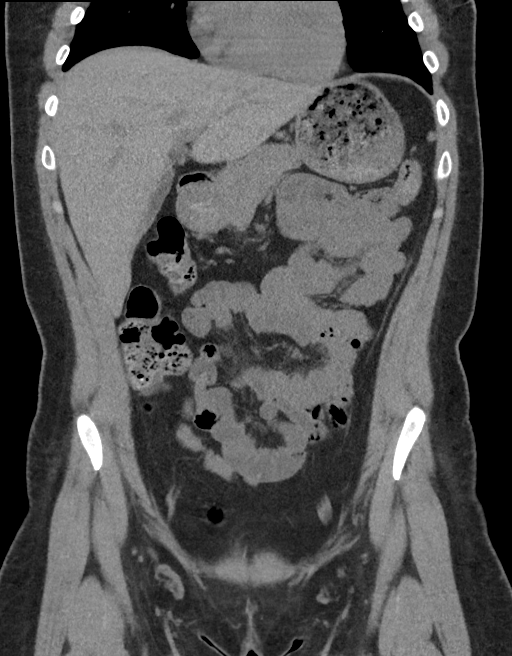
[im 61/137  soft-tissue]
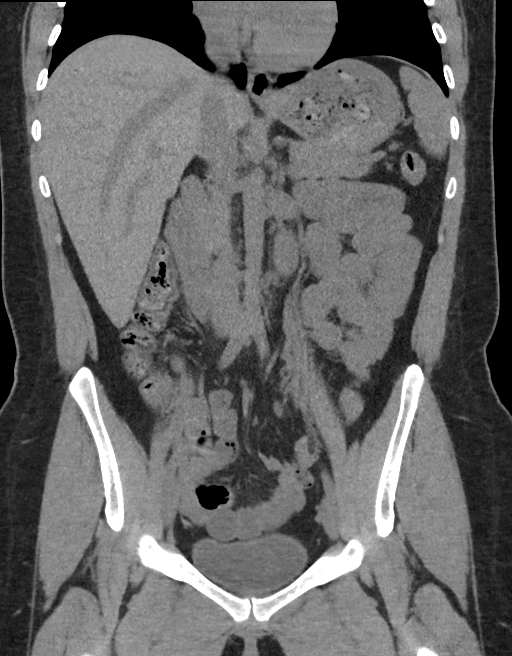
[im 61/137  bone]
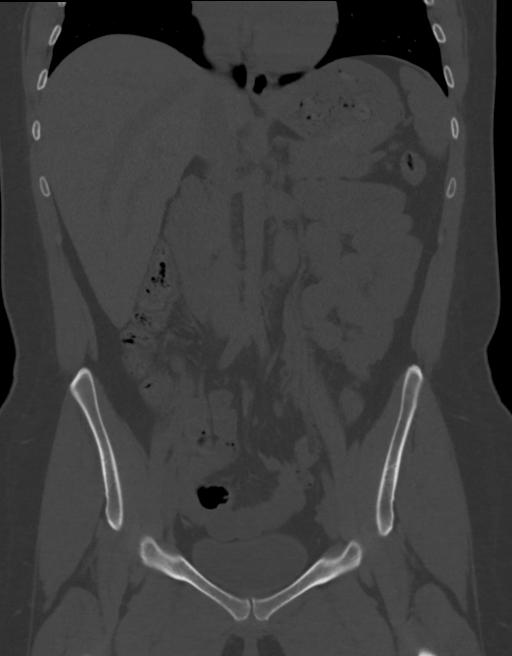
[im 76/137  soft-tissue]
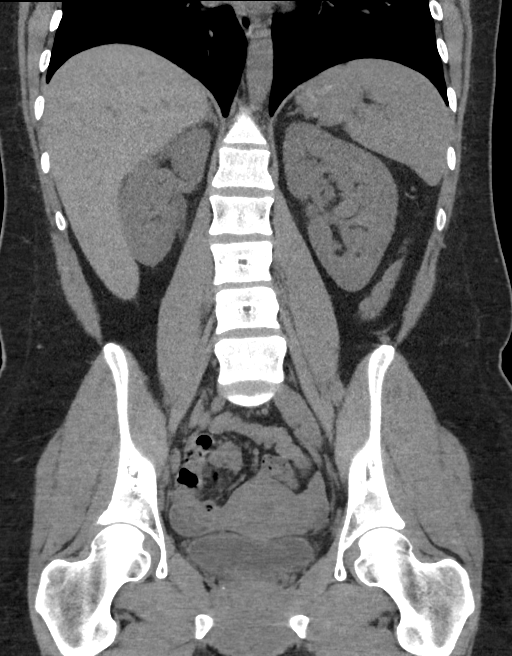

[Series 6: sagittal · sagittal · 0.53mm/px · 1 of 173 slices shown, 2 images]
[im 58/173  soft-tissue]
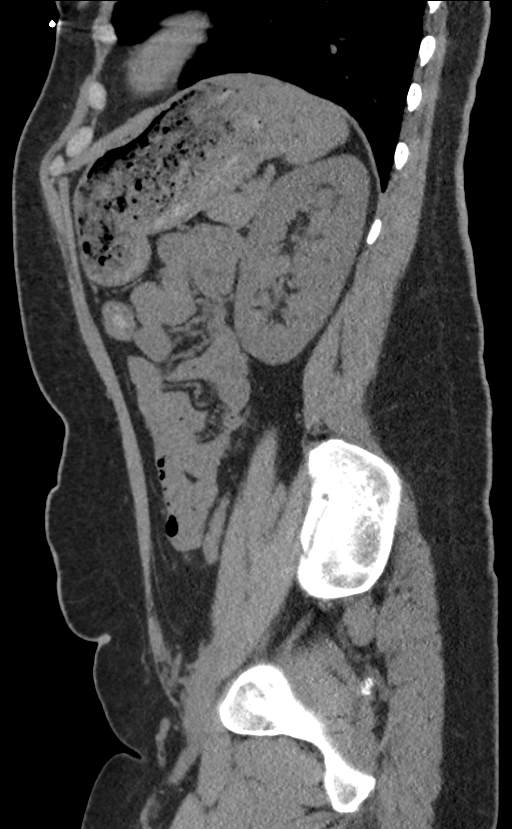
[im 58/173  bone]
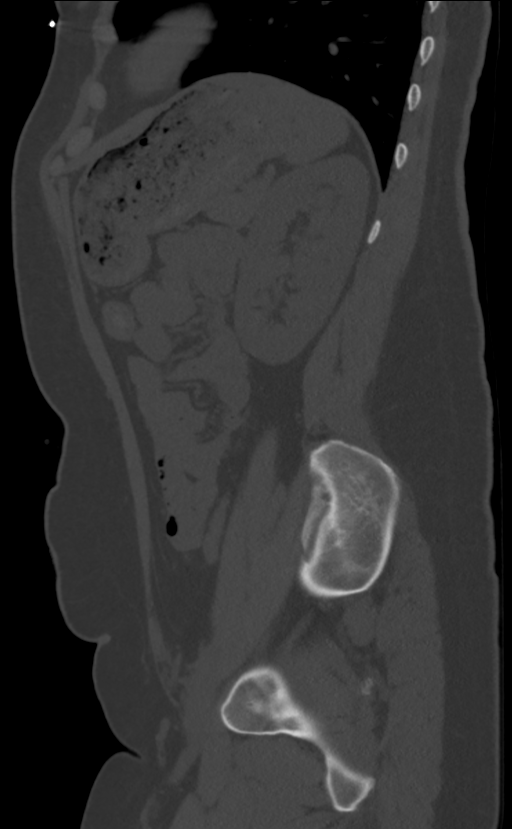

[9 of 46 positions shown; findings below may reference images not displayed]

FINDINGS: Lower chest: The visualized lung bases are grossly clear. The
visualized portions of the mediastinum are unremarkable.

Hepatobiliary: The liver is unremarkable in appearance. The
gallbladder is unremarkable in appearance. The common bile duct
remains normal in caliber.

Pancreas: The pancreas is within normal limits.

Spleen: The spleen is unremarkable in appearance.

Adrenals/Urinary Tract: The adrenal glands are unremarkable in
appearance.

Small nonobstructing bilateral renal stones measure up to 4 mm on
the right. There is no evidence of hydronephrosis. There is haziness
of the peripelvic fat on the right, raising question for mild
right-sided pyelonephritis. No perinephric stranding is seen. No
definite obstructing ureteral stones are identified.

Stomach/Bowel: The stomach is unremarkable in appearance. The small
bowel is within normal limits. The appendix is normal in caliber,
without evidence of appendicitis. The colon is unremarkable in
appearance.

Vascular/Lymphatic: The abdominal aorta is unremarkable in
appearance. The inferior vena cava is grossly unremarkable. No
retroperitoneal lymphadenopathy is seen. No pelvic sidewall
lymphadenopathy is identified.

Reproductive: Minimal soft tissue inflammation is suggested about
the bladder, concerning for cystitis. The uterus is grossly
unremarkable. The ovaries are relatively symmetric. No suspicious
adnexal masses are seen.

Other: No additional soft tissue abnormalities are seen.

Musculoskeletal: No acute osseous abnormalities are identified. The
visualized musculature is unremarkable in appearance.
IMPRESSION: 1. Minimal soft tissue inflammation suggested about the bladder,
concerning for cystitis.
2. Minimal soft tissue inflammation suggested about the peripelvic
fat on the right, raising question for mild right-sided
pyelonephritis.
3. Small nonobstructing bilateral renal stones measure up to 4 mm on
the right.

## 2020-04-26 ENCOUNTER — Ambulatory Visit (LOCAL_COMMUNITY_HEALTH_CENTER): Payer: Medicaid Other

## 2020-04-26 ENCOUNTER — Other Ambulatory Visit: Payer: Self-pay

## 2020-04-26 DIAGNOSIS — Z111 Encounter for screening for respiratory tuberculosis: Secondary | ICD-10-CM

## 2020-04-29 ENCOUNTER — Ambulatory Visit (LOCAL_COMMUNITY_HEALTH_CENTER): Payer: Medicaid Other

## 2020-04-29 ENCOUNTER — Other Ambulatory Visit: Payer: Self-pay

## 2020-04-29 DIAGNOSIS — Z111 Encounter for screening for respiratory tuberculosis: Secondary | ICD-10-CM

## 2020-04-29 LAB — TB SKIN TEST
Induration: 0 mm
TB Skin Test: NEGATIVE
